# Patient Record
Sex: Female | Born: 1950 | Race: White | Hispanic: No | Smoking: Former smoker
Health system: Southern US, Community
[De-identification: ages and names within clinical notes are randomized; demographics above are authoritative.]

## PROBLEM LIST (undated history)

## (undated) DIAGNOSIS — E785 Hyperlipidemia, unspecified: Secondary | ICD-10-CM

## (undated) DIAGNOSIS — Z720 Tobacco use: Secondary | ICD-10-CM

## (undated) DIAGNOSIS — I10 Essential (primary) hypertension: Secondary | ICD-10-CM

## (undated) DIAGNOSIS — I2510 Atherosclerotic heart disease of native coronary artery without angina pectoris: Secondary | ICD-10-CM

## (undated) DIAGNOSIS — R413 Other amnesia: Secondary | ICD-10-CM

## (undated) DIAGNOSIS — F0390 Unspecified dementia without behavioral disturbance: Secondary | ICD-10-CM

## (undated) HISTORY — DX: Other amnesia: R41.3

## (undated) HISTORY — PX: CORONARY ANGIOPLASTY WITH STENT PLACEMENT: SHX49

## (undated) HISTORY — PX: OTHER SURGICAL HISTORY: SHX169

## (undated) HISTORY — DX: Hyperlipidemia, unspecified: E78.5

---

## 2002-08-15 ENCOUNTER — Emergency Department (HOSPITAL_COMMUNITY): Admission: EM | Admit: 2002-08-15 | Discharge: 2002-08-15 | Payer: Self-pay | Admitting: Emergency Medicine

## 2002-08-15 ENCOUNTER — Encounter: Payer: Self-pay | Admitting: Emergency Medicine

## 2006-06-30 ENCOUNTER — Emergency Department (HOSPITAL_COMMUNITY): Admission: EM | Admit: 2006-06-30 | Discharge: 2006-06-30 | Payer: Self-pay | Admitting: Emergency Medicine

## 2006-09-08 ENCOUNTER — Emergency Department (HOSPITAL_COMMUNITY): Admission: EM | Admit: 2006-09-08 | Discharge: 2006-09-08 | Payer: Self-pay | Admitting: Emergency Medicine

## 2011-10-08 ENCOUNTER — Emergency Department (HOSPITAL_COMMUNITY): Payer: No Typology Code available for payment source

## 2011-10-08 ENCOUNTER — Inpatient Hospital Stay (HOSPITAL_COMMUNITY)
Admission: EM | Admit: 2011-10-08 | Discharge: 2011-10-11 | DRG: 247 | Disposition: A | Discharge status: Home / Self Care | Payer: No Typology Code available for payment source | Attending: Cardiovascular Disease | Admitting: Cardiovascular Disease

## 2011-10-08 ENCOUNTER — Encounter (HOSPITAL_COMMUNITY): Payer: Self-pay

## 2011-10-08 ENCOUNTER — Encounter (HOSPITAL_COMMUNITY)
Admission: EM | Disposition: A | Discharge status: Home / Self Care | Payer: Self-pay | Source: Home / Self Care | Attending: Cardiovascular Disease

## 2011-10-08 DIAGNOSIS — I2510 Atherosclerotic heart disease of native coronary artery without angina pectoris: Secondary | ICD-10-CM

## 2011-10-08 DIAGNOSIS — F17200 Nicotine dependence, unspecified, uncomplicated: Secondary | ICD-10-CM | POA: Diagnosis present

## 2011-10-08 DIAGNOSIS — Z720 Tobacco use: Secondary | ICD-10-CM

## 2011-10-08 DIAGNOSIS — I2119 ST elevation (STEMI) myocardial infarction involving other coronary artery of inferior wall: Principal | ICD-10-CM | POA: Diagnosis present

## 2011-10-08 DIAGNOSIS — R079 Chest pain, unspecified: Secondary | ICD-10-CM

## 2011-10-08 DIAGNOSIS — E119 Type 2 diabetes mellitus without complications: Secondary | ICD-10-CM | POA: Diagnosis present

## 2011-10-08 DIAGNOSIS — I10 Essential (primary) hypertension: Secondary | ICD-10-CM | POA: Diagnosis present

## 2011-10-08 DIAGNOSIS — I498 Other specified cardiac arrhythmias: Secondary | ICD-10-CM | POA: Diagnosis present

## 2011-10-08 DIAGNOSIS — I213 ST elevation (STEMI) myocardial infarction of unspecified site: Secondary | ICD-10-CM

## 2011-10-08 DIAGNOSIS — E876 Hypokalemia: Secondary | ICD-10-CM | POA: Diagnosis present

## 2011-10-08 DIAGNOSIS — Z955 Presence of coronary angioplasty implant and graft: Secondary | ICD-10-CM

## 2011-10-08 DIAGNOSIS — I219 Acute myocardial infarction, unspecified: Secondary | ICD-10-CM

## 2011-10-08 HISTORY — DX: Tobacco use: Z72.0

## 2011-10-08 HISTORY — PX: LEFT HEART CATHETERIZATION WITH CORONARY ANGIOGRAM: SHX5451

## 2011-10-08 HISTORY — DX: Atherosclerotic heart disease of native coronary artery without angina pectoris: I25.10

## 2011-10-08 HISTORY — DX: Essential (primary) hypertension: I10

## 2011-10-08 LAB — CBC WITH DIFFERENTIAL/PLATELET
Basophils Absolute: 0 10*3/uL (ref 0.0–0.1)
Eosinophils Relative: 0 % (ref 0–5)
Lymphocytes Relative: 14 % (ref 12–46)
Lymphs Abs: 1.7 10*3/uL (ref 0.7–4.0)
MCV: 97.5 fL (ref 78.0–100.0)
Neutrophils Relative %: 79 % — ABNORMAL HIGH (ref 43–77)
Platelets: 194 10*3/uL (ref 150–400)
RBC: 3.99 MIL/uL (ref 3.87–5.11)
RDW: 13.2 % (ref 11.5–15.5)
WBC: 12.3 10*3/uL — ABNORMAL HIGH (ref 4.0–10.5)

## 2011-10-08 LAB — COMPREHENSIVE METABOLIC PANEL
ALT: 68 U/L — ABNORMAL HIGH (ref 0–35)
AST: 190 U/L — ABNORMAL HIGH (ref 0–37)
Alkaline Phosphatase: 60 U/L (ref 39–117)
CO2: 30 mEq/L (ref 19–32)
Calcium: 9.9 mg/dL (ref 8.4–10.5)
Glucose, Bld: 216 mg/dL — ABNORMAL HIGH (ref 70–99)
Potassium: 3.5 mEq/L (ref 3.5–5.1)
Sodium: 138 mEq/L (ref 135–145)
Total Protein: 7 g/dL (ref 6.0–8.3)

## 2011-10-08 LAB — CARDIAC PANEL(CRET KIN+CKTOT+MB+TROPI)
Relative Index: 4.5 — ABNORMAL HIGH (ref 0.0–2.5)
Relative Index: 3.2 — ABNORMAL HIGH (ref 0.0–2.5)
Total CK: 1985 U/L — ABNORMAL HIGH (ref 7–177)
Troponin I: >20 ng/mL (ref <0.30)

## 2011-10-08 LAB — MRSA PCR SCREENING: MRSA by PCR: NEGATIVE

## 2011-10-08 LAB — PROTIME-INR
INR: 1.02 (ref 0.00–1.49)
Prothrombin Time: 13.6 seconds (ref 11.6–15.2)

## 2011-10-08 LAB — GLUCOSE, CAPILLARY
Glucose-Capillary: 170 mg/dL — ABNORMAL HIGH (ref 70–99)
Glucose-Capillary: 181 mg/dL — ABNORMAL HIGH (ref 70–99)

## 2011-10-08 LAB — HEMOGLOBIN A1C: Mean Plasma Glucose: 137 mg/dL — ABNORMAL HIGH (ref <117)

## 2011-10-08 SURGERY — LEFT HEART CATHETERIZATION WITH CORONARY ANGIOGRAM
Anesthesia: LOCAL

## 2011-10-08 MED ORDER — BIVALIRUDIN 250 MG IV SOLR
INTRAVENOUS | Status: AC
  Filled 2011-10-08: qty 250

## 2011-10-08 MED ORDER — ASPIRIN 81 MG PO CHEW: 81.0000 mg | CHEWABLE_TABLET | Freq: Every day | ORAL | Status: DC

## 2011-10-08 MED ORDER — PRASUGREL HCL 10 MG PO TABS
ORAL_TABLET | ORAL | Status: AC
  Filled 2011-10-08: qty 6

## 2011-10-08 MED ORDER — ASPIRIN EC 81 MG PO TBEC
81.0000 mg | DELAYED_RELEASE_TABLET | Freq: Every day | ORAL | Status: DC
  Administered 2011-10-09 – 2011-10-11 (×3): 81 mg via ORAL
  Filled 2011-10-08 (×3): qty 1

## 2011-10-08 MED ORDER — LORAZEPAM 2 MG/ML IJ SOLN
0.5000 mg | Freq: Once | INTRAMUSCULAR | Status: AC
  Administered 2011-10-08: 0.5 mg via INTRAVENOUS
  Filled 2011-10-08: qty 1

## 2011-10-08 MED ORDER — ZOLPIDEM TARTRATE 5 MG PO TABS: 5.0000 mg | ORAL_TABLET | Freq: Every evening | ORAL | Status: DC | PRN

## 2011-10-08 MED ORDER — PNEUMOCOCCAL VAC POLYVALENT 25 MCG/0.5ML IJ INJ
0.5000 mL | INJECTION | INTRAMUSCULAR | Status: AC
  Filled 2011-10-08: qty 0.5

## 2011-10-08 MED ORDER — NICOTINE 21 MG/24HR TD PT24
21.0000 mg | MEDICATED_PATCH | Freq: Every day | TRANSDERMAL | Status: DC
  Administered 2011-10-08 – 2011-10-11 (×4): 21 mg via TRANSDERMAL
  Filled 2011-10-08 (×4): qty 1

## 2011-10-08 MED ORDER — ONDANSETRON HCL 4 MG/2ML IJ SOLN: 4.0000 mg | Freq: Four times a day (QID) | INTRAMUSCULAR | Status: DC | PRN

## 2011-10-08 MED ORDER — MIDAZOLAM HCL 2 MG/2ML IJ SOLN
INTRAMUSCULAR | Status: AC
  Filled 2011-10-08: qty 2

## 2011-10-08 MED ORDER — FENTANYL CITRATE 0.05 MG/ML IJ SOLN
INTRAMUSCULAR | Status: AC
  Filled 2011-10-08: qty 2

## 2011-10-08 MED ORDER — ACETAMINOPHEN 325 MG PO TABS: 650.0000 mg | ORAL_TABLET | ORAL | Status: DC | PRN

## 2011-10-08 MED ORDER — NITROGLYCERIN 0.4 MG SL SUBL: 0.4000 mg | SUBLINGUAL_TABLET | SUBLINGUAL | Status: DC | PRN

## 2011-10-08 MED ORDER — ACETAMINOPHEN 325 MG PO TABS
650.0000 mg | ORAL_TABLET | ORAL | Status: DC | PRN
  Administered 2011-10-08 – 2011-10-11 (×4): 650 mg via ORAL
  Filled 2011-10-08 (×4): qty 2

## 2011-10-08 MED ORDER — HEPARIN (PORCINE) IN NACL 100-0.45 UNIT/ML-% IJ SOLN
INTRAMUSCULAR | Status: AC
  Filled 2011-10-08: qty 250

## 2011-10-08 MED ORDER — HEPARIN BOLUS VIA INFUSION
4000.0000 [IU] | Freq: Once | INTRAVENOUS | Status: AC
  Administered 2011-10-08: 4000 [IU] via INTRAVENOUS

## 2011-10-08 MED ORDER — LISINOPRIL 20 MG PO TABS
20.0000 mg | ORAL_TABLET | Freq: Every day | ORAL | Status: DC
  Administered 2011-10-08 – 2011-10-11 (×4): 20 mg via ORAL
  Filled 2011-10-08 (×4): qty 1

## 2011-10-08 MED ORDER — SODIUM CHLORIDE 0.9 % IJ SOLN: 3.0000 mL | INTRAMUSCULAR | Status: DC | PRN

## 2011-10-08 MED ORDER — HEPARIN SODIUM (PORCINE) 5000 UNIT/ML IJ SOLN: 4000.0000 [IU] | INTRAMUSCULAR | Status: DC

## 2011-10-08 MED ORDER — PRASUGREL HCL 10 MG PO TABS
10.0000 mg | ORAL_TABLET | Freq: Every day | ORAL | Status: DC
  Administered 2011-10-09 – 2011-10-11 (×3): 10 mg via ORAL
  Filled 2011-10-08 (×3): qty 1

## 2011-10-08 MED ORDER — SODIUM CHLORIDE 0.9 % IV SOLN
0.2500 mg/kg/h | INTRAVENOUS | Status: AC
  Filled 2011-10-08: qty 250

## 2011-10-08 MED ORDER — FENTANYL CITRATE 0.05 MG/ML IJ SOLN
50.0000 ug | Freq: Once | INTRAMUSCULAR | Status: AC
  Administered 2011-10-08: 50 ug via INTRAVENOUS

## 2011-10-08 MED ORDER — HEPARIN (PORCINE) IN NACL 2-0.9 UNIT/ML-% IJ SOLN
INTRAMUSCULAR | Status: AC
  Filled 2011-10-08: qty 2000

## 2011-10-08 MED ORDER — ATROPINE SULFATE 1 MG/ML IJ SOLN
INTRAMUSCULAR | Status: AC
  Filled 2011-10-08: qty 1

## 2011-10-08 MED ORDER — SODIUM CHLORIDE 0.9 % IV SOLN
INTRAVENOUS | Status: DC
  Administered 2011-10-08: 12:00:00 via INTRAVENOUS

## 2011-10-08 MED ORDER — SODIUM CHLORIDE 0.9 % IV SOLN: INTRAVENOUS | Status: AC

## 2011-10-08 MED ORDER — NITROGLYCERIN 0.2 MG/ML ON CALL CATH LAB
INTRAVENOUS | Status: AC
  Filled 2011-10-08: qty 1

## 2011-10-08 MED ORDER — INSULIN ASPART 100 UNIT/ML ~~LOC~~ SOLN
0.0000 [IU] | Freq: Three times a day (TID) | SUBCUTANEOUS | Status: DC
  Administered 2011-10-08 – 2011-10-09 (×2): 3 [IU] via SUBCUTANEOUS
  Administered 2011-10-09 (×2): 2 [IU] via SUBCUTANEOUS
  Administered 2011-10-10 (×2): 3 [IU] via SUBCUTANEOUS

## 2011-10-08 MED ORDER — SODIUM CHLORIDE 0.9 % IV SOLN: 250.0000 mL | INTRAVENOUS | Status: DC | PRN

## 2011-10-08 MED ORDER — SODIUM CHLORIDE 0.9 % IJ SOLN
3.0000 mL | Freq: Two times a day (BID) | INTRAMUSCULAR | Status: DC
  Administered 2011-10-09 – 2011-10-10 (×3): 3 mL via INTRAVENOUS

## 2011-10-08 MED ORDER — LIDOCAINE HCL (PF) 1 % IJ SOLN
INTRAMUSCULAR | Status: AC
  Filled 2011-10-08: qty 30

## 2011-10-08 NOTE — CV Procedure (Signed)
   Cardiac Catheterization Operative Report  Nichole May [id number] 8/26/20131:50 PM Harlow Asa, MD  Procedure Performed:  1. Left Heart Catheterization 2. Selective Coronary Angiography 3. Left ventricular angiogram 4. PTCA/DES RCA   Operator: Verne Carrow, MD  Indication:    Acute inferior STEMI                         Procedure Details: The risks, benefits, complications, treatment options, and expected outcomes were discussed with the patient. The patient and/or family concurred with the proposed plan, giving verbal informed consent. This was placed in the chart as an emergency consent. The patient was brought to the cath lab via EMS. The patient was sedated with Versed and Fentanyl. The right groin was prepped and draped in the usual manner. Using the modified Seldinger access technique, a 6 French sheath was placed in the right femoral artery. Standard diagnostic catheters were used to perform selective coronary angiography. A JR4 guide was used to engage the RCA. She was found to have a totally occluded proximal RCA. I engaged the RCA with a JR4 guiding catheter. She was given a bolus of Angiomax and a drip was started. She was given Effient 60 mg po x 1. I then advanced a Cougar IC wire down the RCA. A 2.5 x 15 mm balloon was inflated twice in the total occlusion proximally. Flow was restored with the balloon inflations. I then deployed a 2.5 x 28 mm Promus Element DES in the proximal to mid RCA. This was post-dilated with a 2.5 x 20 mm [place] balloon x 2. There was an excellent angiographic result. The PL branch had good flow. The PDA had excellent flow but was totally occluded in the very distal segment, likely from downstream thrombus. The guiding catheter and wire were removed. A pigtail catheter was used to perform a left ventricular angiogram.  There were no immediate complications. The patient was taken to the recovery area in stable condition.   Hemodynamic  Findings: Central aortic pressure: 113/59 Left ventricular pressure: 107/13/18  Angiographic Findings:  Left main: No obstructive disease noted.   Left Anterior Descending Artery: Large caliber vessel that courses to the apex. The mid LAD has a 70% stenosis at the takeoff of the small diagonal branch. The distal LAD has mild plaque disease.   Circumflex Artery: Moderate sized vessel with 100% occlusion in the mid segment just after the small first obtuse marginal branch. The distal Circumflex and second marginal branch fills from left to left collaterals.   Right Coronary Artery: 100% proximal occlusion.   Left Ventricular Angiogram: LVEF =40% with inferior wall hypokinesis.   Impression: 1. Acute inferior STEMI secondary to occluded proximal RCA, now s/p DES x 1 proximal RCA 2. Moderate disease in mid LAD 3. 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 Segmental LV systolic dysfunction.   Recommendations: Will admit to CCU. Will run Angiomax for 2 hours at reduced rate. ASA/Effient/Ace-inh. No statin with elevated LFTs. No beta blocker with bradycardia. Will get echo in am. Will medically manage LAD disease for now. If she has recurrent angina, will need functional test on LAD (FFR).        Complications:  None. The patient tolerated the procedure well.

## 2011-10-08 NOTE — ED Notes (Signed)
Pt c/o burning in chest and epigastric area x 3 days.  Says pain radiates into both shoulders.

## 2011-10-08 NOTE — ED Provider Notes (Addendum)
History  This chart was scribed for No att. providers found by Shari Heritage. The patient was seen in room MCCL/NONE. Patient's care was started at 1140.     CSN: [id number]  Arrival date & time 10/08/11  1140   First MD Initiated Contact with Patient 10/08/11 1154      Chief Complaint  Patient presents with  . Chest Pain    Patient is a 61 y.o. female presenting with chest pain. The history is provided by the patient. No language interpreter was used.  Chest Pain The chest pain began 3 - 5 days ago (Episode today started at 7:30am). Chest pain occurs intermittently. The chest pain is resolved. The pain is associated with exertion. The severity of the pain is severe. The quality of the pain is described as aching. The pain does not radiate. Chest pain is worsened by exertion. Pertinent negatives for primary symptoms include no shortness of breath, no nausea and no vomiting.  Pertinent negatives for associated symptoms include no diaphoresis. Risk factors include smoking/tobacco exposure.  Her past medical history is significant for diabetes and hypertension.  Her family medical history is significant for early MI in family.    ORETHA WEISMANN is a 61 y.o. female with a 3-day history of chest and epigastric pain who presents to the Emergency Department complaining of aching, non-radiating, intermittent, severe chest pain onset 3 days ago. She states that her pain is completely gone now. Patient says that exertional chest pain began on Friday 3 days ago at work which she noticed when walking to the breakroom. She denies having had pain all day yesterday or the day before that. Pt awakened with pain this morning at 7:30 am and it was constant today.  Patient was sent to the ED by her PCP [name] after she saw him this morning for the same complaint. [name] gave her aspirin 324 mg to chew at 11:30am today. Patient denies nausea or vomiting. No diaphoresis or SOB. Pt has never had this pain  before.   Patient smokes 0.5 packs/day. She does not drink alcohol. Patient has a medical history of HTN and diabetes.  PCP Dr [name]  Past Medical History  Diagnosis Date  . Diabetes mellitus   . Hypertension   . Tobacco abuse   . CAD (coronary artery disease)     10/08/11 Inf STEMI    Past Surgical History  Procedure Date  . Denies     Family History  Problem Relation Age of Onset  . Heart attack Father    FOP MI  History  Substance Use Topics  . Smoking status: Current Everyday Smoker -- 0.5 packs/day    Types: Cigarettes  . Smokeless tobacco: Not on file  . Alcohol Use: No  employed  OB History    Grav Para Term Preterm Abortions TAB SAB Ect Mult Living                  Review of Systems  Constitutional: Negative for diaphoresis.  Respiratory: Negative for shortness of breath.   Cardiovascular: Positive for chest pain.  Gastrointestinal: Negative for nausea and vomiting.  All other systems reviewed and are negative.    Allergies  Review of patient's allergies indicates no known allergies.  Home Medications  No current outpatient prescriptions on file. Lisinopril Metformin  BP 153/71  Temp 98.1 F (36.7 C) (Oral)  Resp 20  Ht 5\' 6"  (1.676 m)  Wt 167 lb (75.751 kg)  BMI 26.95  kg/m2  SpO2 97%  Vital signs normal    Physical Exam  Nursing note and vitals reviewed. Constitutional: She is oriented to person, place, and time. She appears well-developed and well-nourished.  Non-toxic appearance. She does not appear ill. No distress.  HENT:  Head: Normocephalic and atraumatic.  Right Ear: External ear normal.  Left Ear: External ear normal.  Nose: Nose normal. No mucosal edema or rhinorrhea.  Mouth/Throat: Oropharynx is clear and moist and mucous membranes are normal. No dental abscesses or uvula swelling.  Eyes: Conjunctivae normal and EOM are normal. Pupils are equal, round, and reactive to light.  Neck: Normal range of motion and full  passive range of motion without pain. Neck supple.  Cardiovascular: Normal rate, regular rhythm and normal heart sounds.  Exam reveals no gallop and no friction rub.   No murmur heard. Pulmonary/Chest: Effort normal and breath sounds normal. No respiratory distress. She has no wheezes. She has no rhonchi. She has no rales. She exhibits no tenderness and no crepitus.  Abdominal: Soft. Normal appearance and bowel sounds are normal. She exhibits no distension. There is no tenderness. There is no rebound and no guarding.  Musculoskeletal: Normal range of motion. She exhibits no edema and no tenderness.       Moves all extremities well.   Neurological: She is alert and oriented to person, place, and time. She has normal strength. No cranial nerve deficit.  Skin: Skin is warm, dry and intact. No rash noted. No erythema. No pallor.  Psychiatric: She has a normal mood and affect. Her speech is normal and behavior is normal. Her mood appears not anxious.    ED Course  Procedures (including critical care time)   Medications  0.9 %  sodium chloride infusion (  Intravenous New Bag/Given 10/08/11 1224)  heparin 100-0.45 UNIT/ML-% infusion (not administered)  heparin bolus via infusion 4,000 Units (  Intravenous MAR Hold 10/08/11 1243)   NTG not started, pt is painfree at this point and has inferior MI with brief episode of bradycardia to 38 in the ED.   DIAGNOSTIC STUDIES: Oxygen Saturation is 97% on room air, adequate by my interpretation.    COORDINATION OF CARE: 11:56am- Patient informed of current plan for treatment and evaluation and agrees with plan at this time.   12:05 Dr Sanjuana Kava accepts in transfer to [facility] cath lab   Results for orders placed during the hospital encounter of 10/08/11  CBC WITH DIFFERENTIAL      Component Value Range   WBC 12.3 (*) 4.0 - 10.5 K/uL   RBC 3.99  3.87 - 5.11 MIL/uL   Hemoglobin 13.3  12.0 - 15.0 g/dL   HCT 16.1  09.6 - 04.5 %   MCV 97.5  78.0 - 100.0 fL     MCH 33.3  26.0 - 34.0 pg   MCHC 34.2  30.0 - 36.0 g/dL   RDW 40.9  81.1 - 91.4 %   Platelets 194  150 - 400 K/uL   Neutrophils Relative 79 (*) 43 - 77 %   Neutro Abs 9.7 (*) 1.7 - 7.7 K/uL   Lymphocytes Relative 14  12 - 46 %   Lymphs Abs 1.7  0.7 - 4.0 K/uL   Monocytes Relative 7  3 - 12 %   Monocytes Absolute 0.9  0.1 - 1.0 K/uL   Eosinophils Relative 0  0 - 5 %   Eosinophils Absolute 0.0  0.0 - 0.7 K/uL   Basophils Relative 0  0 -  1 %   Basophils Absolute 0.0  0.0 - 0.1 K/uL  COMPREHENSIVE METABOLIC PANEL      Component Value Range   Sodium 138  135 - 145 mEq/L   Potassium 3.5  3.5 - 5.1 mEq/L   Chloride 98  96 - 112 mEq/L   CO2 30  19 - 32 mEq/L   Glucose, Bld 216 (*) 70 - 99 mg/dL   BUN 18  6 - 23 mg/dL   Creatinine, Ser 4.09  0.50 - 1.10 mg/dL   Calcium 9.9  8.4 - 81.1 mg/dL   Total Protein 7.0  6.0 - 8.3 g/dL   Albumin 4.2  3.5 - 5.2 g/dL   AST 914 (*) 0 - 37 U/L   ALT 68 (*) 0 - 35 U/L   Alkaline Phosphatase 60  39 - 117 U/L   Total Bilirubin 0.9  0.3 - 1.2 mg/dL   GFR calc non Af Amer >90  >90 mL/min   GFR calc Af Amer >90  >90 mL/min  TROPONIN I      Component Value Range   Troponin I >20.00 (*) <0.30 ng/mL  APTT      Component Value Range   aPTT 27  24 - 37 seconds  PROTIME-INR      Component Value Range   Prothrombin Time 13.6  11.6 - 15.2 seconds   INR 1.02  0.00 - 1.49  GLUCOSE, CAPILLARY      Component Value Range   Glucose-Capillary 170 (*) 70 - 99 mg/dL   . Laboratory interpretation all normal except hyperglycemia, + troponin    Dg Chest Portable 1 View  10/08/2011  *RADIOLOGY REPORT*  Clinical Data: 61 year old female with chest pain.  Hypertension diabetes.  ST elevation myocardial infarction.  PORTABLE CHEST - 1 VIEW  Comparison: None.  Findings: Portable upright AP view 1210 hours.  Cardiac size at the upper limits of normal or mildly enlarged. Other mediastinal contours are within normal limits.  Visualized tracheal air column is within  normal limits.  No pneumothorax, pulmonary edema, pleural effusion or airspace disease.  IMPRESSION: Mild cardiomegaly. No acute cardiopulmonary abnormality.   Original Report Authenticated By: Harley Hallmark, M.D.      #1  Date: 10/08/2011  Rate: 83  Rhythm: normal sinus rhythm  QRS Axis: normal  Intervals: normal  ST/T Wave abnormalities: ST elevations inferiorly and ST depressions laterally  Conduction Disutrbances:none  Narrative Interpretation:   Old EKG Reviewed: none available  #2  Date: 10/08/2011  Rate: 48  Rhythm: sinus bradycardia  QRS Axis: normal  Intervals: normal  ST/T Wave abnormalities: More pronounced ST elevation inferiorly and ST depression laterally with flipped TW in lateral chest leads  Conduction Disutrbances:none  Narrative Interpretation:   Old EKG Reviewed: changes noted EKG done slightly earlier      1. Chest pain   2. Acute myocardial infarction   3. STEMI (ST elevation myocardial infarction)     Plan transfer to [facility] Cath Lab  MDM   I personally performed the services described in this documentation, which was scribed in my presence. The recorded information has been reviewed and considered.  Devoria Albe, MD, FACEP    Ward Givens, MD 10/08/11 1421  Ward Givens, MD 11/07/11 1505

## 2011-10-08 NOTE — ED Notes (Signed)
Called [facility] CODE STEMI 

## 2011-10-08 NOTE — H&P (Signed)
I have personally seen and examined this patient with [facility], PA-C. I agree with the assessment and plan as outlined above. Emergent cath. cdm

## 2011-10-08 NOTE — Progress Notes (Signed)
CTSP for agitation and confusion; patient is s/p PCI following inferior MI; patient given ativan 1 hour prior to sheath pull. She then became confused and combative. She moves all extremities. BP 119/70 and HR 70. No focal neurological findings; patient given romazicon with some improvement; follow closely. Doubt CVA but if she doesn't improve, may need head CT. Olga Millers 6:47 PM

## 2011-10-08 NOTE — Care Management Note (Addendum)
    Page 1 of 1   10/09/2011     9:51:15 AM   CARE MANAGEMENT NOTE 10/09/2011  Patient:  JANCY, SPRANKLE   Account Number:  [id number]  Date Initiated:  10/08/2011  Documentation initiated by:  Junius Creamer  Subjective/Objective Assessment:   adm w mi     Action/Plan:   lives w husband, pcp dr Nelva Nay luking   Anticipated DC Date:     Anticipated DC Plan:        DC Planning Services  CM consult      Choice offered to / List presented to:             Status of service:   Medicare Important Message given?   (If response is "NO", the following Medicare IM given date fields will be blank) Date Medicare IM given:   Date Additional Medicare IM given:    Discharge Disposition:    Per UR Regulation:  Reviewed for med. necessity/level of care/duration of stay  If discussed at Long Length of Stay Meetings, dates discussed:    Comments:  8/27 9:49a debbie Liliauna Santoni rn,bsn spoke w pt who came back as [phone] copay. gave pt 30days free effient card and effient copay assist card.  8/26 14:40p debbie Jonanthan Bolender rn,bsn [phone] will get cm sec to ck on copay for effient.

## 2011-10-08 NOTE — Progress Notes (Signed)
Jessica PA notified of increasing agitation and refusal to keep right leg straight. Patient verbalizes understanding of risk of moving from side to side and sitting straight up in bed. Patient attempting OOB without calling nurse. Pt oriented and states "I understand what your saying mamma". Family at bedside. Daughter states she is used to a "cigarette hanging out of her mouth every 5 minutes".  Patient repeatedly instructed by nurse the risks of increased movement and need to lay still. New orders received. Will continue to follow closely.

## 2011-10-08 NOTE — Progress Notes (Signed)
Patient became very agitated and restless at beginning of sheath removal. Patient awakened upon entering room to pull sheath. Unable to follow commands or verbalize understanding of situation. Patient confused and anxious.Patient thrashing her fists at the nurses and kicking vigorously. Patient stating she is at home and other answers are slurred. No weakness to either side or facial drooping noted. MD called to bedside. Ramazicon given per verbal order. Patient placed in 4 point restraints, knee immobilizer, and posey belt. After ramazicon given patient did state she was at the hospital. Family updated and at bedside to help calm the patient. MD to follow closely tonight and nurse to monitor and continue close assessment.

## 2011-10-08 NOTE — Progress Notes (Signed)
Right femoral sheath removed using manual pressure. Held for 20 minutes. Hemostasis achieved at 1815. VSS.

## 2011-10-08 NOTE — H&P (Signed)
History and Physical  Patient ID: Nichole May MRN: [id number], SOB: [birth year] 61 y.o. Date of Encounter: 10/08/2011, 1:19 PM  Primary Physician: Harlow Asa, MD Primary Cardiologist: None  Chief Complaint: chest pain  HPI: 61 y.o. female w/ PMHx significant for DM, HTN, and tobacco abuse who presented to [facility] on 10/08/2011 with complaints of chest pain and was subsequently transferred to [facility] for Inferior STEMI.  No prior cardiac history. She reported onset of chest pain last Friday (10/05/11) that has been intermittent. She went to her PCP office this morning where she reportedly received 324mg  ASA and was sent to the [facility] ED. She was chest pain free in the ED. EKG showed ST elevation II, III, aVF, ST depression I, aVL, V4-6. She had a brief episode of bradycardia in the ED (38bpm). She was started on Heparin drip and transferred to [facility] cath lab.  Past Medical History  Diagnosis Date  . Diabetes mellitus   . Hypertension   . Tobacco abuse   . CAD (coronary artery disease)     10/08/11 Inf STEMI     Surgical History:  Past Surgical History  Procedure Date  . Denies      Home Meds:  Lisinopril Metformin  Allergies: No Known Allergies  History   Social History  . Marital Status: Married    Spouse Name: N/A    Number of Children: N/A  . Years of Education: N/A   Occupational History  . Not on file.   Social History Main Topics  . Smoking status: Current Everyday Smoker -- 0.5 packs/day    Types: Cigarettes  . Smokeless tobacco: Not on file  . Alcohol Use: No  . Drug Use: No  . Sexually Active: Not on file   Other Topics Concern  . Not on file   Social History Narrative  . No narrative on file     Family History  Problem Relation Age of Onset  . Heart attack Father     Review of Systems: General: negative for chills, fever, night sweats or weight changes.  Cardiovascular: (+) chest pain; negative for shortness  of breath, dyspnea on exertion, edema, orthopnea, palpitations, paroxysmal nocturnal dyspnea  Dermatological: negative for rash Respiratory: negative for cough or wheezing Urologic: negative for hematuria Abdominal: negative for nausea, vomiting, diarrhea, bright red blood per rectum, melena, or hematemesis Neurologic: negative for visual changes, syncope, or dizziness All other systems reviewed and are otherwise negative except as noted above.  Labs:   Lab 10/08/11 1153  NA 138  K 3.5  CL 98  CO2 30  BUN 18  CREATININE 0.59  CALCIUM 9.9  PROT 7.0  BILITOT 0.9  ALKPHOS 60  ALT 68*  AST 190*  GLUCOSE 216*     10/08/2011 12:04  Prothrombin Time 13.6  INR 1.02    10/08/2011 11:53  Troponin I >20.00 (HH)   Radiology/Studies:  No results found.   EKG: 10/08/11 @ 1143 - sinus rhythm with 1mm ST elevation II, III, aVF and ST depression I, aVL, V4-6  Physical Exam: Blood pressure 104/79, temperature 98.1 F (36.7 C), temperature source Oral, resp. rate 18, height 5\' 6"  (1.676 m), weight 160 lb (72.576 kg), SpO2 99.00%. General: Overweight elderly white female, in no acute distress. Head: Normocephalic, atraumatic, sclera non-icteric, nares are without discharge Neck: Supple. Negative for carotid bruits. JVD not elevated. Lungs: Clear anteriorly. Breathing is unlabored. Heart: RRR with S1 S2. No murmurs, rubs, or  gallops appreciated. Abdomen: Soft, non-tender, non-distended with normoactive bowel sounds. No rebound/guarding. No obvious abdominal masses. Msk:  Strength and tone appear normal for age. Extremities: No edema. No clubbing or cyanosis. Distal pedal pulses are intact and equal bilaterally. Neuro: Alert and oriented X 3. Moves all extremities spontaneously. Psych:  Responds to questions appropriately with a normal affect.    ASSESSMENT AND PLAN:  61 y.o. female w/ PMHx significant for DM, HTN, and tobacco abuse who presented to [facility] on 10/08/2011 with  complaints of chest pain and was subsequently transferred to [facility] for Inferior STEMI.  1. Acute Inferior STEMI: Patient has no prior cardiac history. Cardiac risk factors including HTN, DM, age, tobacco abuse. Presents with 3 days of intermittent chest pain and EKG showing inferior ST elevation. Currently undergoing cardiac cath. Further plans pending results. Place on ASA. No BB given bradycardia. No statin given elevated LFTs. Check Lipid panel and plan to start statin once LFTs improve.  2. Elevated LFTs: In the setting of #1. Continue to monitor.  3. Leukocytosis: In the setting of #1. Afebrile. CXR clear.  4. Hypertension: BP stable. Cont Lisinopril  5. Diabetes Mellitus, Type 2: Hold Metformin. Place on SSI. Check A1C  6. Tobacco Abuse: Encourage cessation.    Signed, Addasyn Mcbreen PA-C 10/08/2011, 1:19 PM

## 2011-10-09 DIAGNOSIS — F17200 Nicotine dependence, unspecified, uncomplicated: Secondary | ICD-10-CM

## 2011-10-09 DIAGNOSIS — E119 Type 2 diabetes mellitus without complications: Secondary | ICD-10-CM

## 2011-10-09 DIAGNOSIS — I2510 Atherosclerotic heart disease of native coronary artery without angina pectoris: Secondary | ICD-10-CM

## 2011-10-09 DIAGNOSIS — Z720 Tobacco use: Secondary | ICD-10-CM

## 2011-10-09 DIAGNOSIS — I517 Cardiomegaly: Secondary | ICD-10-CM

## 2011-10-09 DIAGNOSIS — I219 Acute myocardial infarction, unspecified: Secondary | ICD-10-CM

## 2011-10-09 LAB — BASIC METABOLIC PANEL
BUN: 17 mg/dL (ref 6–23)
CO2: 23 mEq/L (ref 19–32)
Calcium: 9.1 mg/dL (ref 8.4–10.5)
Chloride: 103 mEq/L (ref 96–112)
Creatinine, Ser: 0.43 mg/dL — ABNORMAL LOW (ref 0.50–1.10)
Glucose, Bld: 168 mg/dL — ABNORMAL HIGH (ref 70–99)
Potassium: 3.4 mEq/L — ABNORMAL LOW (ref 3.5–5.1)
Sodium: 140 mEq/L (ref 135–145)

## 2011-10-09 LAB — CBC
HCT: 35.8 % — ABNORMAL LOW (ref 36.0–46.0)
MCHC: 33.8 g/dL (ref 30.0–36.0)
Platelets: 145 10*3/uL — ABNORMAL LOW (ref 150–400)
RDW: 13.1 % (ref 11.5–15.5)
WBC: 12.3 10*3/uL — ABNORMAL HIGH (ref 4.0–10.5)

## 2011-10-09 LAB — LIPID PANEL
HDL: 33 mg/dL — ABNORMAL LOW (ref >39)
LDL Cholesterol: 62 mg/dL (ref 0–99)
Total CHOL/HDL Ratio: 4.1 RATIO
VLDL: 40 mg/dL (ref 0–40)

## 2011-10-09 LAB — CARDIAC PANEL(CRET KIN+CKTOT+MB+TROPI)
Relative Index: 2.3 (ref 0.0–2.5)
Total CK: 1412 U/L — ABNORMAL HIGH (ref 7–177)
Troponin I: >20 ng/mL (ref <0.30)

## 2011-10-09 MED ORDER — POTASSIUM CHLORIDE CRYS ER 20 MEQ PO TBCR
60.0000 meq | EXTENDED_RELEASE_TABLET | Freq: Once | ORAL | Status: AC
  Administered 2011-10-09: 60 meq via ORAL
  Filled 2011-10-09: qty 3

## 2011-10-09 MED FILL — Dextrose Inj 5%: INTRAVENOUS | Qty: 50 | Status: AC

## 2011-10-09 NOTE — Progress Notes (Signed)
SUBJECTIVE: No chest pain or SOB this am. Confusion mostly resolved.  BP 113/79  Pulse 66  Temp 98.3 F (36.8 C) (Oral)  Resp 34  Ht 5\' 5"  (1.651 m)  Wt 156 lb 8.4 oz (71 kg)  BMI 26.05 kg/m2  SpO2 96%  Intake/Output Summary (Last 24 hours) at 10/09/11 0710 Last data filed at 10/08/11 2100  Gross per 24 hour  Intake 669.08 ml  Output    650 ml  Net  19.08 ml    PHYSICAL EXAM General: Well developed, well nourished, in no acute distress. Alert and oriented x 2. Still confused about time of day. Psych:  Good affect, responds appropriately Neck: No JVD. No masses noted.  Lungs: Faint wheezes bilaterally. No crackles noted.  Heart: RRR with no murmurs noted. Abdomen: Bowel sounds are present. Soft, non-tender.  Extremities: No lower extremity edema. Right groin cath site ok.   LABS: Basic Metabolic Panel:  Basename 10/09/11 0255 10/08/11 1153  NA 140 138  K 3.4* 3.5  CL 103 98  CO2 23 30  GLUCOSE 168* 216*  BUN 17 18  CREATININE 0.43* 0.59  CALCIUM 9.1 9.9  MG -- --  PHOS -- --   CBC:  Basename 10/09/11 0255 10/08/11 1153  WBC 12.3* 12.3*  NEUTROABS -- 9.7*  HGB 12.1 13.3  HCT 35.8* 38.9  MCV 97.0 97.5  PLT 145* 194   Cardiac Enzymes:  Basename 10/09/11 0255 10/08/11 2021 10/08/11 1453  CKTOTAL 1412* 1924* 1985*  CKMB 32.7* 61.1* 90.0*  CKMBINDEX -- -- --  TROPONINI >20.00* >20.00* >20.00*   Fasting Lipid Panel:  Basename 10/09/11 0255  CHOL 135  HDL 33*  LDLCALC 62  TRIG 469*  CHOLHDL 4.1  LDLDIRECT --    Current Meds:    . aspirin EC  81 mg Oral Daily  . atropine      . bivalirudin      . fentaNYL      . fentaNYL  50 mcg Intravenous Once  . heparin      . heparin      . heparin  4,000 Units Intravenous Once  . insulin aspart  0-15 Units Subcutaneous TID WC  . lidocaine      . lisinopril  20 mg Oral Daily  . LORazepam  0.5 mg Intravenous Once  . midazolam      . nicotine  21 mg Transdermal Daily  . nitroGLYCERIN      .  pneumococcal 23 valent vaccine  0.5 mL Intramuscular Tomorrow-1000  . prasugrel      . prasugrel  10 mg Oral Daily  . sodium chloride  3 mL Intravenous Q12H  . DISCONTD: aspirin  81 mg Oral Daily  . DISCONTD: heparin  4,000 Units Intravenous STAT     ASSESSMENT AND PLAN:  1. Acute inferior STEMI: Emergent cath 10/08/11. Proximal RCA with 100% occlusion. Now s/p DES x 1 proximal RCA. She is on ASA and Effient. She will need dual anti-platelet therapy for at least one year. Beta blocker was not started secondary to bradycardia. HR in 50s overnight. Will not start beta blocker today secondary to bradycardia. Continue Ace-inhibitor. No statin secondary to abnormal LFTs. Will recheck LFTs in am.  Echo today.   2. CAD: As above. Admitted with STEMI.   3. DM: SSI for now. Restart metformin 48 hours post cath.   4. Hypokalemia: Replace potassium this am  5. Tobacco abuse: Smoking cessation has been advised. Nicotine patch in place.  6. Dispo: To stepdown today. Ambulate with cardiac rehab.   MCALHANY,CHRISTOPHER  8/27/20137:10 AM

## 2011-10-09 NOTE — Progress Notes (Signed)
CARDIAC REHAB PHASE I   PRE:  Rate/Rhythm: 56 SB    BP: sitting 137/70    SaO2:   MODE:  Ambulation: 170 ft   POST:  Rate/Rhythm: 75 SR    BP: sitting 137/76     SaO2: 98 RA after a few min rest  Pt with strange affect. Sts many times "I don't care about anything". Could not get out of pt if she has been feeling like this for long. Sts she doesn't feel well today. Very tired. Slightly unsteady walking. Rested at half way due to SOB and fatigue. Denied CP. Able to make it to new room but asked multiple times on the way if we were there yet. Collapsed into recliner. VSS seemingly stable although did not have pulse ox immediately available after walk. Left pt in recliner. Seems exhausted. Left MI book. Unable to hear ed right now. Will f/u. [phone]  Harriet Masson CES, ACSM

## 2011-10-09 NOTE — Progress Notes (Signed)
  Echocardiogram 2D Echocardiogram has been performed.  [place] 10/09/2011, 10:37 AM

## 2011-10-10 LAB — COMPREHENSIVE METABOLIC PANEL
Albumin: 3 g/dL — ABNORMAL LOW (ref 3.5–5.2)
Alkaline Phosphatase: 58 U/L (ref 39–117)
BUN: 17 mg/dL (ref 6–23)
CO2: 26 mEq/L (ref 19–32)
Chloride: 105 mEq/L (ref 96–112)
GFR calc non Af Amer: >90 mL/min (ref >90)
Potassium: 3.5 mEq/L (ref 3.5–5.1)
Total Bilirubin: 0.8 mg/dL (ref 0.3–1.2)

## 2011-10-10 LAB — CBC
MCH: 33.9 pg (ref 26.0–34.0)
MCV: 99.1 fL (ref 78.0–100.0)
Platelets: 154 10*3/uL (ref 150–400)
RDW: 13.3 % (ref 11.5–15.5)
WBC: 9.7 10*3/uL (ref 4.0–10.5)

## 2011-10-10 LAB — GLUCOSE, CAPILLARY

## 2011-10-10 MED ORDER — PNEUMOCOCCAL VAC POLYVALENT 25 MCG/0.5ML IJ INJ
0.5000 mL | INJECTION | Freq: Once | INTRAMUSCULAR | Status: DC
  Filled 2011-10-10: qty 0.5

## 2011-10-10 MED ORDER — POTASSIUM CHLORIDE CRYS ER 20 MEQ PO TBCR
40.0000 meq | EXTENDED_RELEASE_TABLET | Freq: Once | ORAL | Status: AC
  Administered 2011-10-10: 40 meq via ORAL
  Filled 2011-10-10: qty 2

## 2011-10-10 MED ORDER — ATORVASTATIN CALCIUM 40 MG PO TABS
40.0000 mg | ORAL_TABLET | Freq: Every day | ORAL | Status: DC
  Administered 2011-10-10: 40 mg via ORAL
  Filled 2011-10-10 (×4): qty 1

## 2011-10-10 NOTE — Progress Notes (Signed)
SUBJECTIVE: No chest pain or SOB.   BP 109/65  Pulse 59  Temp 99.5 F (37.5 C) (Oral)  Resp 16  Ht 5\' 5"  (1.651 m)  Wt 156 lb 8.4 oz (71 kg)  BMI 26.05 kg/m2  SpO2 90%  Intake/Output Summary (Last 24 hours) at 10/10/11 8119 Last data filed at 10/09/11 1800  Gross per 24 hour  Intake    720 ml  Output      0 ml  Net    720 ml    PHYSICAL EXAM General: Well developed, well nourished, in no acute distress. Alert and oriented x 3.  Psych:  Good affect, responds appropriately Neck: No JVD. No masses noted.  Lungs: Clear bilaterally with no wheezes or rhonci noted.  Heart: RRR with no murmurs noted. Abdomen: Bowel sounds are present. Soft, non-tender.  Extremities: No lower extremity edema. Right groin cath site stable.   LABS: Basic Metabolic Panel:  Basename 10/10/11 0600 10/09/11 0255  NA 141 140  K 3.5 3.4*  CL 105 103  CO2 26 23  GLUCOSE 157* 168*  BUN 17 17  CREATININE 0.42* 0.43*  CALCIUM 8.5 9.1  MG -- --  PHOS -- --   CBC:  Basename 10/10/11 0600 10/09/11 0255 10/08/11 1153  WBC 9.7 12.3* --  NEUTROABS -- -- 9.7*  HGB 11.1* 12.1 --  HCT 32.4* 35.8* --  MCV 99.1 97.0 --  PLT 154 145* --   Cardiac Enzymes:  Basename 10/09/11 0255 10/08/11 2021 10/08/11 1453  CKTOTAL 1412* 1924* 1985*  CKMB 32.7* 61.1* 90.0*  CKMBINDEX -- -- --  TROPONINI >20.00* >20.00* >20.00*   Fasting Lipid Panel:  Basename 10/09/11 0255  CHOL 135  HDL 33*  LDLCALC 62  TRIG 147*  CHOLHDL 4.1  LDLDIRECT --    Current Meds:    . aspirin EC  81 mg Oral Daily  . insulin aspart  0-15 Units Subcutaneous TID WC  . lisinopril  20 mg Oral Daily  . nicotine  21 mg Transdermal Daily  . pneumococcal 23 valent vaccine  0.5 mL Intramuscular Tomorrow-1000  . potassium chloride  60 mEq Oral Once  . prasugrel  10 mg Oral Daily  . sodium chloride  3 mL Intravenous Q12H   Echo 10/10/11: Left ventricle: The cavity size was normal. Wall thickness was increased in a pattern of  mild LVH. Systolic function was mildly to moderately reduced. The estimated ejection fraction was in the range of 40% to 45%. Basal to mid inferior and inferoseptal severe hypokinesis to akinesis. Basal to mid posterior severe hypokinesis. Doppler parameters are consistent with abnormal left ventricular relaxation (grade 1 diastolic dysfunction). - Aortic valve: Poorly visualized. Probably trileaflet. There was no stenosis. - Mitral valve: No significant regurgitation. - Right ventricle: The cavity size was mildly dilated. Systolic function was mildly reduced. - Right atrium: The atrium was mildly dilated. - Pulmonary arteries: No complete TR doppler jet so unable to estimate PA systolic pressure. - Systemic veins: IVC measured 2.2 cm with some respirophasic variation, suggesting RA pressure 10 mmHg. Impressions:  - Normal LV size with midl LV hypertrophy. EF 40-45% with basal to mid inferior, inferoseptal, and posterior severe hypokinesis to akinesis. Mildly dilated RV with mildly decreased RV systolic function. No significant valvular abnormalities.    ASSESSMENT AND PLAN:  1. Acute inferior STEMI: Emergent cath 10/08/11. Proximal RCA with 100% occlusion. Now s/p DES x 1 proximal RCA. She is on ASA and Effient. She will need dual  anti-platelet therapy for at least one year. Beta blocker was not started secondary to bradycardia. HR is 59 this am.  Will not start beta blocker today secondary to bradycardia. Continue Ace-inhibitor. No statin secondary to abnormal LFTs on admission but now near normal. Will add statin today. Medical management of moderate disease in LAD and totally occluded Circumflex (good collaterals).    2. CAD: As above. Admitted with STEMI. CK trending down.   3. DM: SSI for now. Restart metformin tomorrow. (holding 48 hours post cath).  4. Hypokalemia: Potassium replaced yesterday. BMET pending this am.   5. Tobacco abuse: Smoking cessation has been advised.  Nicotine patch in place.   6. Dispo: To telemetry today. Ambulate with cardiac rehab.     Nichole May  8/28/20137:07 AM

## 2011-10-10 NOTE — Progress Notes (Signed)
CARDIAC REHAB PHASE I   PRE:  Rate/Rhythm: 84 SR    BP: sitting 102/66    SaO2:   MODE:  Ambulation: 380 ft   POST:  Rate/Rhythm: 84 SR    BP: sitting 130/74     SaO2:   Pt steady but tires very easily. Sts she has been like this for 1 yr. Feels exhausted back in room after walk. VSS. Pt slightly anxious/paranoid. Began explaining MI then family came in, which increased reception of education. Pt motivated to quit smoking. Sts she is not craving cigarette now. Encouraged patches. Gave resources. Requests her name be sent to [facility]. Needs to walk more.  [phone]   Harriet Masson CES, ACSM

## 2011-10-11 LAB — CBC
HCT: 31.1 % — ABNORMAL LOW (ref 36.0–46.0)
Hemoglobin: 10.6 g/dL — ABNORMAL LOW (ref 12.0–15.0)
MCV: 98.1 fL (ref 78.0–100.0)
RBC: 3.17 MIL/uL — ABNORMAL LOW (ref 3.87–5.11)
WBC: 7.6 10*3/uL (ref 4.0–10.5)

## 2011-10-11 LAB — BASIC METABOLIC PANEL
CO2: 25 mEq/L (ref 19–32)
Chloride: 104 mEq/L (ref 96–112)
Sodium: 140 mEq/L (ref 135–145)

## 2011-10-11 LAB — GLUCOSE, CAPILLARY: Glucose-Capillary: 130 mg/dL — ABNORMAL HIGH (ref 70–99)

## 2011-10-11 MED ORDER — PRASUGREL HCL 10 MG PO TABS: 10.0000 mg | ORAL_TABLET | Freq: Every day | ORAL | Status: DC

## 2011-10-11 MED ORDER — POTASSIUM CHLORIDE CRYS ER 20 MEQ PO TBCR: 20.0000 meq | EXTENDED_RELEASE_TABLET | Freq: Once | ORAL | Status: DC

## 2011-10-11 MED ORDER — NICOTINE 21 MG/24HR TD PT24: 1.0000 | MEDICATED_PATCH | Freq: Every day | TRANSDERMAL | Status: AC

## 2011-10-11 MED ORDER — POTASSIUM CHLORIDE CRYS ER 20 MEQ PO TBCR
20.0000 meq | EXTENDED_RELEASE_TABLET | Freq: Once | ORAL | Status: AC
  Administered 2011-10-11: 40 meq via ORAL
  Filled 2011-10-11: qty 2

## 2011-10-11 MED ORDER — POTASSIUM CHLORIDE CRYS ER 20 MEQ PO TBCR
40.0000 meq | EXTENDED_RELEASE_TABLET | Freq: Once | ORAL | Status: AC
  Administered 2011-10-11: 40 meq via ORAL

## 2011-10-11 MED ORDER — METFORMIN HCL 500 MG PO TABS
500.0000 mg | ORAL_TABLET | Freq: Two times a day (BID) | ORAL | Status: DC
  Administered 2011-10-11: 500 mg via ORAL
  Filled 2011-10-11 (×3): qty 1

## 2011-10-11 MED ORDER — NITROGLYCERIN 0.4 MG SL SUBL: 0.4000 mg | SUBLINGUAL_TABLET | SUBLINGUAL | Status: DC | PRN

## 2011-10-11 NOTE — Progress Notes (Signed)
SUBJECTIVE: No events. No chest pain or SOB.   BP 128/84  Pulse 59  Temp 98.7 F (37.1 C) (Oral)  Resp 16  Ht 5\' 5"  (1.651 m)  Wt 165 lb 11.2 oz (75.161 kg)  BMI 27.57 kg/m2  SpO2 91%  Intake/Output Summary (Last 24 hours) at 10/11/11 1610 Last data filed at 10/10/11 1730  Gross per 24 hour  Intake    720 ml  Output      0 ml  Net    720 ml    PHYSICAL EXAM General: Well developed, well nourished, in no acute distress. Alert and oriented x 3.  Psych:  Good affect, responds appropriately Neck: No JVD. No masses noted.  Lungs: Clear bilaterally with no wheezes or rhonci noted.  Heart: RRR with no murmurs noted. Abdomen: Bowel sounds are present. Soft, non-tender.  Extremities: No lower extremity edema.   LABS: Basic Metabolic Panel:  Basename 10/10/11 0600 10/09/11 0255  NA 141 140  K 3.5 3.4*  CL 105 103  CO2 26 23  GLUCOSE 157* 168*  BUN 17 17  CREATININE 0.42* 0.43*  CALCIUM 8.5 9.1  MG -- --  PHOS -- --   CBC:  Basename 10/11/11 0503 10/10/11 0600 10/08/11 1153  WBC 7.6 9.7 --  NEUTROABS -- -- 9.7*  HGB 10.6* 11.1* --  HCT 31.1* 32.4* --  MCV 98.1 99.1 --  PLT 171 154 --   Cardiac Enzymes:  Basename 10/09/11 0255 10/08/11 2021 10/08/11 1453  CKTOTAL 1412* 1924* 1985*  CKMB 32.7* 61.1* 90.0*  CKMBINDEX -- -- --  TROPONINI >20.00* >20.00* >20.00*   Fasting Lipid Panel:  Basename 10/09/11 0255  CHOL 135  HDL 33*  LDLCALC 62  TRIG 960*  CHOLHDL 4.1  LDLDIRECT --    Current Meds:    . aspirin EC  81 mg Oral Daily  . atorvastatin  40 mg Oral q1800  . insulin aspart  0-15 Units Subcutaneous TID WC  . lisinopril  20 mg Oral Daily  . nicotine  21 mg Transdermal Daily  . pneumococcal 23 valent vaccine  0.5 mL Intramuscular Tomorrow-1000  . pneumococcal 23 valent vaccine  0.5 mL Intramuscular Once  . potassium chloride  40 mEq Oral Once  . prasugrel  10 mg Oral Daily  . sodium chloride  3 mL Intravenous Q12H     ASSESSMENT AND  PLAN:  1. Acute inferior STEMI: Emergent cath 10/08/11. Proximal RCA with 100% occlusion. Now s/p DES x 1 proximal RCA. She is on ASA and Effient. She will need dual anti-platelet therapy for at least one year. Beta blocker was not started secondary to bradycardia. HR has been in the 50s and low 60s. Will not start beta blocker today secondary to bradycardia. Continue Ace-inhibitor. LFTs were elevated on admission but near normal yesterday. Statin started yesterday.  Medical management of moderate disease in LAD and totally occluded Circumflex (good collaterals).   2. CAD: As above. Admitted with STEMI.  3. DM: Restart metformin today. (Was held for 48 hours post cath).   4. Hypokalemia: Potassium replaced yesterday. BMET pending this am.   5. Tobacco abuse: Smoking cessation has been advised. Nicotine patch in place.   6. Dispo: Home this am if she ambulates and feels well. She will follow up in our [place] office in 2 weeks. She needs an Effient starter packet and an excuse for work for this week. I would like for her to be off until she is seen in  the office in 2 weeks. F/U on BMET before d/c home-still pending at this time.      MCALHANY,CHRISTOPHER  8/29/20136:56 AM

## 2011-10-11 NOTE — Progress Notes (Signed)
CARDIAC REHAB PHASE I   PRE:  Rate/Rhythm: 55 SB asleep    BP: sitting 106/60    SaO2:   MODE:  Ambulation: 500 ft   POST:  Rate/Rhythm: 90 SR    BP: sitting 126/72     SaO2: 95 RA  Pt asleep on arrival, husband present. Very reluctant to walk. Made sarcastic comments. Eventually convinced her to ambulate with persistence. Pt demeanor about the same walking, sts she is tired in general. Multiple sighing, supposedly due to fatigue, but continued walking with increased distance and zero rest stops. HR to 90 SR. DOE. Family sts she has been fatigued like this for 8 mos. They laugh off her strange comments. Pt anxious to go home. [phone]  Harriet Masson CES, ACSM

## 2011-10-11 NOTE — Discharge Summary (Addendum)
CARDIOLOGY DISCHARGE SUMMARY   Patient ID: Nichole May MRN: [id number] DOB/AGE: [birth year] 61 y.o.  Admit date: 10/08/2011 Discharge date: 10/11/2011  Primary Discharge Diagnosis: Acute inferior STEMI - s/p 2.5 x 28 mm Promus Element DES to the RCA  Secondary Discharge Diagnosis:  Past Medical History  Diagnosis Date  . Diabetes mellitus   . Hypertension   . Tobacco abuse   . CAD (coronary artery disease)     10/08/11 Inf STEMI   Procedure Performed:  1. Left Heart Catheterization 2. Selective Coronary Angiography 3. Left ventricular angiogram 4. PTCA/DES RCA  5. 2D Echocardiogram  Hospital Course: Ms. Nichole May is a 61 year old female with no previous history of coronary artery disease. She went to [facility] emergency room where her ECG was consistent with an inferior ST elevation MI. She was transferred emergently to [facility] and taken directly to the Cath Lab.  The full cardiac catheterization results are below. She received a drug-eluting stent to the RCA. She was taken to CCU and admitted. She tolerated the procedure well. She was counseled on smoking cessation and started on a nicotine patch. She was seen by cardiac rehabilitation. She is on an ACE inhibitor and her metformin was held because of the heart catheterization. Her sugars were managed with sliding scale insulin.   She was bradycardic at times with a heart rate as low as 38. Therefore, a beta blocker was not used. Her blood sugars were under good control. She was mildly anemic post-procedure but this is felt secondary to blood draws and procedures. She should followup with her primary care physician for this. She should check stool guaiacs as an outpatient. She had been on an ACE inhibitor prior to admission and this was continued. A lipid profile showed dyslipidemia and a statin was added to her medication regimen. She had some elevation in her AST and ALT on admission but these improved prior to discharge and should  be followed as an outpatient. A 2-D echocardiogram showed left ventricular dysfunction with segmental wall motion abnormalities. She is to continue her ACE inhibitor and this will be followed as an outpatient as well.  On 10/11/2011, [name] was seen by Dr. Clifton James and by cardiac rehabilitation. Her potassium was low and was supplemented. She was ambulating without chest pain or shortness of breath and considered stable for discharge, to follow up as an outpatient.  Labs:   Lab Results  Component Value Date   WBC 7.6 10/11/2011   HGB 10.6* 10/11/2011   HCT 31.1* 10/11/2011   MCV 98.1 10/11/2011   PLT 171 10/11/2011    Lab 10/11/11 0503 10/10/11 0600  NA 140 --  K 3.1* --  CL 104 --  CO2 25 --  BUN 17 --  CREATININE 0.45* --  CALCIUM 8.6 --  PROT -- 5.8*  BILITOT -- 0.8  ALKPHOS -- 58  ALT -- 40*  AST -- 45*  GLUCOSE 128* --    Basename 10/09/11 0255 10/08/11 2021 10/08/11 1453  CKTOTAL 1412* 1924* 1985*  CKMB 32.7* 61.1* 90.0*  CKMBINDEX -- -- --  TROPONINI >20.00* >20.00* >20.00*   Lipid Panel     Component Value Date/Time   CHOL 135 10/09/2011 0255   TRIG 199* 10/09/2011 0255   HDL 33* 10/09/2011 0255   CHOLHDL 4.1 10/09/2011 0255   VLDL 40 10/09/2011 0255   LDLCALC 62 10/09/2011 0255    Basename 10/08/11 1204  INR 1.02      Radiology: Dg Chest Portable  1 View 10/08/2011  *RADIOLOGY REPORT*  Clinical Data: 61 year old female with chest pain.  Hypertension diabetes.  ST elevation myocardial infarction.  PORTABLE CHEST - 1 VIEW  Comparison: None.  Findings: Portable upright AP view 1210 hours.  Cardiac size at the upper limits of normal or mildly enlarged. Other mediastinal contours are within normal limits.  Visualized tracheal air column is within normal limits.  No pneumothorax, pulmonary edema, pleural effusion or airspace disease.  IMPRESSION: Mild cardiomegaly. No acute cardiopulmonary abnormality.   Original Report Authenticated By: Harley Hallmark, M.D.      Cardiac Cath: 10/08/2011 Left main: No obstructive disease noted.  Left Anterior Descending Artery: Large caliber vessel that courses to the apex. The mid LAD has a 70% stenosis at the takeoff of the small diagonal branch. The distal LAD has mild plaque disease.  Circumflex Artery: Moderate sized vessel with 100% occlusion in the mid segment just after the small first obtuse marginal branch. The distal Circumflex and second marginal branch fills from left to left collaterals.  Right Coronary Artery: 100% proximal occlusion.  Left Ventricular Angiogram: LVEF =40% with inferior wall hypokinesis.  Impression:  1. Acute inferior STEMI secondary to occluded proximal RCA, now s/p DES x 1 proximal RCA  2. Moderate disease in mid LAD  3. 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 Segmental LV systolic dysfunction.   EKG: 10-Oct-2011 07:41:00   ** Critical Test Result: STEMI Sinus bradycardia Inferior infarct , possibly acute T wave abnormality, consider anterolateral ischemia ** ** ACUTE MI / STEMI ** ** Consider right ventricular involvement in acute inferior infarct Abnormal ECG No significant change since last tracing Vent. rate 56 BPM PR interval 208 ms QRS duration 108 ms QT/QTc 444/428 ms P-R-T axes 76 32 148  Echo: 10/09/2011 Study Conclusions - Left ventricle: The cavity size was normal. Wall thickness was increased in a pattern of mild LVH. Systolic function was mildly to moderately reduced. The estimated ejection fraction was in the range of 40% to 45%. Basal to mid inferior and inferoseptal severe hypokinesis to akinesis. Basal to mid posterior severe hypokinesis. Doppler parameters are consistent with abnormal left ventricular relaxation (grade 1 diastolic dysfunction). - Aortic valve: Poorly visualized. Probably trileaflet. There was no stenosis. - Mitral valve: No significant regurgitation. - Right ventricle: The cavity size was mildly  dilated. Systolic function was mildly reduced. - Right atrium: The atrium was mildly dilated. - Pulmonary arteries: No complete TR doppler jet so unable to estimate PA systolic pressure. - Systemic veins: IVC measured 2.2 cm with some respirophasic variation, suggesting RA pressure 10 mmHg. Impressions: - Normal LV size with midl LV hypertrophy. EF 40-45% with basal to mid inferior, inferoseptal, and posterior severe hypokinesis to akinesis. Mildly dilated RV with mildly decreased RV systolic function. No significant valvular abnormalities.   FOLLOW UP PLANS AND APPOINTMENTS No Known Allergies Medication List  As of 10/11/2011  9:36 AM   TAKE these medications         lisinopril 20 MG tablet   Commonly known as: PRINIVIL,ZESTRIL   Take 20 mg by mouth daily.      metFORMIN 500 MG tablet   Commonly known as: GLUCOPHAGE   Take 500 mg by mouth 2 (two) times daily with a meal.      nicotine 21 mg/24hr patch   Commonly known as: NICODERM CQ - dosed in mg/24 hours   Place 1 patch onto the skin daily. NO tobacco while wearing patch.  nitroGLYCERIN 0.4 MG SL tablet   Commonly known as: NITROSTAT   Place 1 tablet (0.4 mg total) under the tongue every 5 (five) minutes x 3 doses as needed for chest pain.      prasugrel 10 MG Tabs   Commonly known as: EFFIENT   Take 1 tablet (10 mg total) by mouth daily.          Pt was also asked to take ASA 81 mg daily.  Discharge Orders    Future Appointments: Provider: Department: Dept Phone: Center:   10/25/2011 1:40 PM Jodelle Gross, NP [facility] [phone] [id number]     Future Orders Please Complete By Expires   Amb Referral to Cardiac Rehabilitation      Diet - low sodium heart healthy      Increase activity slowly        Follow-up Information    Follow up with Joni Reining, NP. (September 12th at 1:40 pm)    Contact information:   [address], [address] [place] [address] [phone]          BRING ALL MEDICATIONS WITH YOU TO FOLLOW UP APPOINTMENTS  Time spent with patient to include physician time: 35 min Signed: Theodore Demark 10/11/2011, 9:36 AM Co-Sign MD

## 2011-10-16 NOTE — Discharge Summary (Signed)
See full note by me. cdm

## 2011-10-23 ENCOUNTER — Encounter: Payer: Self-pay | Admitting: Adult Health

## 2011-10-25 ENCOUNTER — Ambulatory Visit (INDEPENDENT_AMBULATORY_CARE_PROVIDER_SITE_OTHER): Payer: PRIVATE HEALTH INSURANCE | Admitting: Adult Health

## 2011-10-25 ENCOUNTER — Encounter: Payer: Self-pay | Admitting: *Deleted

## 2011-10-25 ENCOUNTER — Encounter: Payer: Self-pay | Admitting: Adult Health

## 2011-10-25 VITALS — BP 100/54 | HR 59 | Ht 66.0 in | Wt 158.0 lb

## 2011-10-25 DIAGNOSIS — I2510 Atherosclerotic heart disease of native coronary artery without angina pectoris: Secondary | ICD-10-CM

## 2011-10-25 DIAGNOSIS — I519 Heart disease, unspecified: Secondary | ICD-10-CM

## 2011-10-25 NOTE — Assessment & Plan Note (Addendum)
She is without complaint today of recurrent discomfort in her chest shortness of breath, or weakness. She continues to have some fatigue, however. She states she is not yet ready to return to work and is requesting return to work letter for 11/12/2011. She will continue on DAPT, she will need to be placed also on a statin medication, as I do not see that she is taking one  on discharge. Review of labs this does show that she has hypercholesterolemia, LDL was 62. We will continue to monitor this for risk reduction. I have given her instructions on a low-cholesterol heart healthy diet. She is encouraged to continue smoking cessation.

## 2011-10-25 NOTE — Assessment & Plan Note (Signed)
Heart rate and blood pressure are well-controlled currently. She is not on a diuretic at this time. With the exception of HCTZ as a part of her inhibitor combination medicine. She is without complaint of dyspnea on exertion. She has stopped smoking. We will not make any changes at this time and see her again in one month.

## 2011-10-25 NOTE — Progress Notes (Signed)
   HPI: Nichole May is a 61 year old patient who is being est. in our office after admission to [place] in the setting of an ST elevation MI. Patient had no previous history of coronary artery disease, but went to the emergency room with an EKG consistent with inferior ST elevation. She was transferred emergently to [facility] and taken directly to the catheterization lab. Cardiac catheterization revealed occluded proximal right coronary artery. She had PCI with drug-eluting stent x1 to the proximal right coronary artery. She had moderate disease in the mid LAD of 70% at the takeoff of the small diagonal branch. She had chronic occlusion of circumflex with collateral filling. She had segmental LV systolic dysfunction with an EF of 45%.     She was subsequently placed on Effient 10 mg daily along with aspirin she was continued on her lisinopril HCTZ, and provided a prescription for nitroglycerin. She comes today without further complaint of chest pain or shortness of breath, she has quit smoking, she is interested in finding out more information on heart healthy diet. She works as a Lawyer, and still feels significant fatigue. She is mildly anxious concerning her diagnosis. No Known Allergies  Current Outpatient Prescriptions  Medication Sig Dispense Refill  . lisinopril-hydrochlorothiazide (PRINZIDE,ZESTORETIC) 20-12.5 MG per tablet Take 1 tablet by mouth daily.      . nicotine (NICODERM CQ - DOSED IN MG/24 HOURS) 21 mg/24hr patch Place 1 patch onto the skin daily. NO tobacco while wearing patch.  14 patch  0  . nitroGLYCERIN (NITROSTAT) 0.4 MG SL tablet Place 1 tablet (0.4 mg total) under the tongue every 5 (five) minutes x 3 doses as needed for chest pain.  25 tablet  3  . prasugrel (EFFIENT) 10 MG TABS Take 1 tablet (10 mg total) by mouth daily.  30 tablet  11  . metFORMIN (GLUCOPHAGE) 500 MG tablet Take 500 mg by mouth 2 (two) times daily with a meal.        Past Medical History    Diagnosis Date  . Diabetes mellitus   . Hypertension   . Tobacco abuse   . CAD (coronary artery disease)     10/08/11 Inf STEMI    Past Surgical History  Procedure Date  . Denies     JXB:JYNWGN of systems complete and found to be negative unless listed above  PHYSICAL EXAM BP 100/54  Pulse 59  Ht 5\' 6"  (1.676 m)  Wt 158 lb (71.668 kg)  BMI 25.50 kg/m2  General: Well developed, well nourished, in no acute distress Head: Eyes PERRLA, No xanthomas.   Normal cephalic and atramatic  Lungs: Clear bilaterally to auscultation and percussion, mild bibasilar crackles are noted. Heart: HRRR S1 S2, without MRG.  Pulses are 2+ & equal.            No carotid bruit. No JVD.  No abdominal bruits. No femoral bruits. Abdomen: Bowel sounds are positive, abdomen soft and non-tender without masses or                  Hernia's noted. Msk:  Back normal, normal gait. Normal strength and tone for age. Extremities: No clubbing, cyanosis or edema.  DP +1 Neuro: Alert and oriented X 3. Psych:  Good affect, responds appropriately  EKG: Sinus bradycardia, rate of 59 beats per minute, inferior T-wave abnormalities, with T-wave inversion noted with early repolarization low-voltage in inferior leads.  ASSESSMENT AND PLAN

## 2011-10-25 NOTE — Patient Instructions (Addendum)
Your physician recommends that you continue on your current medications as directed. Please refer to the Current Medication list given to you today.  Your physician recommends that you schedule a follow-up appointment in: 3 months with [facility].  You may return to work on September 30,2013 without any restrictions  Heart Health Diet This diet can help prevent heart disease and stroke. Many factors influence your heart health, including eating and exercise habits. Coronary risk rises a lot with abnormal blood fat (lipid) levels. Cardiac meal planning includes limiting unhealthy fats, increasing healthy fats, and making other small dietary changes. General guidelines are as follows:  Adjust calorie intake to reach and maintain desirable body weight.   Limit total fat intake to less than 30% of total calories. Saturated fat should be less than 7% of calories.   Saturated fats are found in animal products and in some vegetable products. Saturated vegetable fats are found in coconut oil, cocoa butter, palm oil, and palm kernel oil. Read labels carefully to avoid these products as much as possible. Use butter in moderation. Choose tub margarines and oils that have 2 grams of fat or less. Good cooking oils are canola and olive oils.   Practice low-fat cooking techniques. Do not fry food. Instead, broil, bake, boil, steam, grill, roast on a rack, stir-fry, or microwave it. Other fat reducing suggestions include:   Remove the skin from poultry.   Remove all visible fat from meats.   Skim the fat off stews, soups, and gravies before serving them.   Steam vegetables in water or broth instead of sauting them in fat.   Avoid foods with trans fat (or hydrogenated oils), such as commercially fried foods and commercially baked goods. Commercial shortening and deep-frying fats will contain trans fat.   Increase intake of fruits, vegetables, whole grains, and legumes to replace foods high in fat.    Increase consumption of nuts, legumes, and seeds to at least 4 servings weekly. One serving of a legume equals  cup, and 1 serving of nuts or seeds equals  cup.   Choose whole grains more often. Have 3 servings per day (a serving is 1 ounce [oz]).   Have at least 4 cups of fruit and vegetable a day.   Increase your intake of soluble fiber to 10 to 25 grams per day. Soluble fiber binds cholesterol to be removed from the blood. Foods high in soluble fiber are dried beans, citrus fruits, oats, apples, bananas, broccoli, Brussels sprouts, and eggplant.   Try to include foods fortified with plant sterols or stanols, such as yogurt, breads, juices, or margarines. Choose several fortified foods to achieve a daily intake of 2 to 3 grams of plant sterols or stanols.   Foods with omega-3 fats can help reduce your risk of heart disease. Aim to have a 3.5 oz portion of fatty fish twice per week, such as salmon, mackerel, albacore tuna, sardines, lake trout, or herring. If you wish to take a fish oil supplement, choose one that contains 1 gram of both DHA and EPA.   Limit processed meats to 2 servings (3 oz portion) weekly.   Limit the sodium in your diet to 1500 milligrams (mg) per day. If you have high blood pressure, talk to a registered dietitian about a DASH (Dietary Approaches to Stop Hypertension) eating plan.   Limit beverages with added sugar, such as soda, to no more than 36 ounces per week.  CHOOSING FOODS Starches  Allowed: Breads: All kinds (wheat,  rye, raisin, white, oatmeal, [place], [place], and English muffin bread). Low-fat rolls: English muffins, frankfurter and hamburger buns, bagels, pita bread, tortillas (not fried). Pancakes, waffles, biscuits, and muffins made with recommended oil.   Avoid: Products made with saturated or trans fats, oils, or whole milk products. Butter rolls, cheese breads, croissants. Commercial doughnuts, muffins, sweet rolls, biscuits, waffles, pancakes,  store-bought mixes.  Crackers  Allowed: Low-fat crackers and snacks: Animal, graham, rye, saltine (with recommended oil, no lard), oyster, and matzo crackers. Bread sticks, melba toast, rusks, flatbread, pretzels, and light popcorn.   Avoid: High-fat crackers: cheese crackers, butter crackers, and those made with coconut, palm oil, or trans fat (hydrogenated oils). Buttered popcorn.  Cereals  Allowed: Hot or cold whole-grain cereals.   Avoid: Cereals containing coconut, hydrogenated vegetable fat, or animal fat.  Potatoes / Pasta / Rice  Allowed: All kinds of potatoes, rice, and pasta (such as macaroni, spaghetti, and noodles).   Avoid: Pasta or rice prepared with cream sauce or high-fat cheese. Chow mein noodles, [place] fries.  Vegetables  Allowed: All vegetables and vegetable juices.   Avoid: Fried vegetables. Vegetables in cream, butter, or high-fat cheese sauces. Limit coconut. Fruit in cream or custard.  Meat and Meat Substitutes  Allowed: Limit your intake of meat, seafood, and poultry to no more than 6 oz (cooked weight) per day. All lean, well-trimmed beef, veal, pork, and lamb. All chicken and [place] without skin. All fish and shellfish. Wild game: wild duck, rabbit, pheasant, and venison. Meatless dishes: recipes with dried beans, peas, lentils, and tofu (soybean curd). Seeds and nuts: all seeds and most nuts.   Avoid: Prime grade and other heavily marbled and fatty meats, such as short ribs, spare ribs, rib eye roast or steak, frankfurters, sausage, bacon, and high-fat luncheon meats, mutton. Caviar. Commercially fried fish. Domestic duck, goose, venison sausage. Organ meats: liver, gizzard, heart, chitterlings, brains, kidney, sweetbreads.  Dairy  Allowed: Egg whites or low-cholesterol egg substitutes may be used as desired. Low-fat cheeses: nonfat or low-fat cottage cheese (1% or 2% fat), cheeses made with part skim milk, such as mozzarella, farmers, string, or ricotta.  (Cheeses should be labeled no more than 2 to 6 grams fat per oz.)   Avoid: Whole milk cheeses, including colby, cheddar, [place], [address], [place], [place], [place], [address], Swiss, and blue. Creamed cottage cheese, cream cheese.  Milk  Allowed: Skim (or 1%) milk: liquid, powdered, or evaporated. Buttermilk made with low-fat milk. Drinks made with skim or low-fat milk or cocoa. Chocolate milk or cocoa made with skim or low-fat (1%) milk. Nonfat or low-fat yogurt.   Avoid: Whole milk and whole milk products, including buttermilk or yogurt made from whole milk, drinks made from whole milk. Condensed milk, evaporated whole milk, and 2% milk.  Soups and Combination Foods  Allowed: Low-fat low-sodium soups: broth, dehydrated soups, homemade broth, soups with the fat removed, homemade cream soups made with skim or low-fat milk. Low-fat spaghetti, lasagna, chili, and Spanish rice if low-fat ingredients and low-fat cooking techniques are used.   Avoid: Cream soups made with whole milk, cream, or high-fat cheese. All other soups.  Desserts and Sweets  Allowed: Sherbet, fruit ices, gelatins, meringues, and angel food cake. Homemade desserts with recommended fats, oils, and milk products. Jam, jelly, honey, marmalade, sugars, and syrups. Pure sugar candy, such as gum drops, hard candy, jelly beans, marshmallows, mints, and small amounts of dark chocolate.   Avoid: Commercially prepared cakes, pies, cookies, frosting, pudding, or mixes for  these products. Desserts containing whole milk products, chocolate, coconut, lard, palm oil, or palm kernel oil. Ice cream or ice cream drinks. Candy that contains chocolate, coconut, butter, hydrogenated fat, or unknown ingredients. Buttered syrups.  Fats and Oils  Allowed: Vegetable oils: safflower, sunflower, corn, soybean, cottonseed, sesame, canola, olive, or peanut. Non-hydrogenated margarines. Salad dressing or mayonnaise: homemade or commercial, made with a  recommended oil. Low or nonfat salad dressing or mayonnaise.   Limit added fats and oils to 6 to 8 tsp per day (includes fats used in cooking, baking, salads, and spreads on bread). Remember to count the "hidden fats" in foods.   Avoid: Solid fats and shortenings: butter, lard, salt pork, bacon drippings. Gravy containing meat fat, shortening, or suet. Cocoa butter, coconut. Coconut oil, palm oil, palm kernel oil, or hydrogenated oils: these ingredients are often used in bakery products, nondairy creamers, whipped toppings, candy, and commercially fried foods. Read labels carefully. Salad dressings made of unknown oils, sour cream, or cheese, such as blue cheese and Roquefort. Cream, all kinds: half-and-half, light, heavy, or whipping. Sour cream or cream cheese (even if "light" or low-fat). Nondairy cream substitutes: coffee creamers and sour cream substitutes made with palm, palm kernel, hydrogenated oils, or coconut oil.  Beverages  Allowed: Coffee (regular or decaffeinated), tea. diet carbonated beverages, mineral water. Alcohol: Check with your caregiver. Moderation is recommended.   Avoid: Whole milk, regular sodas, and juice drinks with added sugar.  Condiments  Allowed: All seasonings and condiments. Cocoa powder. "Cream" sauces made with recommended ingredients.   Avoid: Carob powder made with hydrogenated fats.  SAMPLE MENU Breakfast   cup orange juice    cup oatmeal   1 slice toast   1 tsp margarine   1 cup skim milk  Lunch  [place] sandwich with 2 oz [place], 2 slices bread   Lettuce and tomato slices   Fresh fruit   Carrot sticks   Coffee or tea   Snack   Fresh fruit or low-fat crackers  Dinner  3 oz lean ground beef   1 baked potato   1 tsp margarine    cup asparagus   Lettuce salad   1 tbs non-creamy dressing    cup peach slices   1 cup skim milk  Document Released: 11/08/2007 Document Revised: 01/18/2011 Document Reviewed:  01/31/2010 [facility] Patient Information 2012 [place], [place].

## 2011-11-05 ENCOUNTER — Telehealth: Payer: Self-pay | Admitting: Adult Health

## 2011-11-05 NOTE — Telephone Encounter (Signed)
This is not on med list. Please advise as to wether or not you feel this is necessary for this patient.  [facility]

## 2011-11-05 NOTE — Discharge Summary (Signed)
This note was signed the day of discharge. cdm

## 2011-11-05 NOTE — Telephone Encounter (Signed)
PT CALLING TO KNOW IF SHE NEEDS TO BE TAKING A 81 MG ASPRIN EVERYDAY

## 2011-11-07 NOTE — Telephone Encounter (Signed)
Discharge summary indicates that she should be taking aspirin 81 mg per day

## 2011-11-08 NOTE — Telephone Encounter (Signed)
Advised patient to take aspirin 81 mg, per instruction.  Verbalizes understanding.

## 2011-12-15 ENCOUNTER — Encounter (HOSPITAL_COMMUNITY): Payer: Self-pay

## 2011-12-15 ENCOUNTER — Emergency Department (HOSPITAL_COMMUNITY)
Admission: EM | Admit: 2011-12-15 | Discharge: 2011-12-15 | Disposition: A | Discharge status: Home / Self Care | Payer: No Typology Code available for payment source | Attending: Emergency Medicine | Admitting: Emergency Medicine

## 2011-12-15 DIAGNOSIS — I10 Essential (primary) hypertension: Secondary | ICD-10-CM | POA: Insufficient documentation

## 2011-12-15 DIAGNOSIS — Y9389 Activity, other specified: Secondary | ICD-10-CM | POA: Insufficient documentation

## 2011-12-15 DIAGNOSIS — Y929 Unspecified place or not applicable: Secondary | ICD-10-CM | POA: Insufficient documentation

## 2011-12-15 DIAGNOSIS — I2510 Atherosclerotic heart disease of native coronary artery without angina pectoris: Secondary | ICD-10-CM | POA: Insufficient documentation

## 2011-12-15 DIAGNOSIS — E119 Type 2 diabetes mellitus without complications: Secondary | ICD-10-CM | POA: Insufficient documentation

## 2011-12-15 DIAGNOSIS — R079 Chest pain, unspecified: Secondary | ICD-10-CM | POA: Insufficient documentation

## 2011-12-15 DIAGNOSIS — Z79899 Other long term (current) drug therapy: Secondary | ICD-10-CM | POA: Insufficient documentation

## 2011-12-15 DIAGNOSIS — R51 Headache: Secondary | ICD-10-CM | POA: Insufficient documentation

## 2011-12-15 DIAGNOSIS — Z7982 Long term (current) use of aspirin: Secondary | ICD-10-CM | POA: Insufficient documentation

## 2011-12-15 DIAGNOSIS — Z87891 Personal history of nicotine dependence: Secondary | ICD-10-CM | POA: Insufficient documentation

## 2011-12-15 LAB — TROPONIN I
Troponin I: <0.3 ng/mL (ref <0.30)
Troponin I: <0.3 ng/mL (ref <0.30)

## 2011-12-15 MED ORDER — ACETAMINOPHEN 325 MG PO TABS
ORAL_TABLET | ORAL | Status: AC
  Administered 2011-12-15: 650 mg via ORAL
  Filled 2011-12-15: qty 2

## 2011-12-15 MED ORDER — ACETAMINOPHEN 325 MG PO TABS
650.0000 mg | ORAL_TABLET | Freq: Once | ORAL | Status: AC
  Administered 2011-12-15: 650 mg via ORAL
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
## 2011-12-15 NOTE — ED Notes (Signed)
Pt went to restroom but forgot to provide specimen.
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
## 2012-01-08 ENCOUNTER — Telehealth: Payer: Self-pay | Admitting: Adult Health

## 2012-01-08 NOTE — Telephone Encounter (Signed)
Will provide samples and apply for Rx Assistance

## 2012-01-08 NOTE — Telephone Encounter (Signed)
Patient states that she can not afford the Effient. / tg

## 2012-01-21 ENCOUNTER — Other Ambulatory Visit: Payer: Self-pay | Admitting: *Deleted

## 2012-02-01 ENCOUNTER — Telehealth: Payer: Self-pay | Admitting: *Deleted

## 2012-02-01 NOTE — Telephone Encounter (Signed)
Message left for patient to return call regarding Effient Samples and patient assistance information.

## 2012-02-18 ENCOUNTER — Ambulatory Visit (INDEPENDENT_AMBULATORY_CARE_PROVIDER_SITE_OTHER): Payer: BC Managed Care – PPO | Admitting: Adult Health

## 2012-02-18 ENCOUNTER — Encounter: Payer: Self-pay | Admitting: Adult Health

## 2012-02-18 ENCOUNTER — Other Ambulatory Visit: Payer: Self-pay | Admitting: Adult Health

## 2012-02-18 VITALS — BP 158/80 | HR 60 | Ht 65.0 in | Wt 156.0 lb

## 2012-02-18 DIAGNOSIS — D649 Anemia, unspecified: Secondary | ICD-10-CM

## 2012-02-18 DIAGNOSIS — I2510 Atherosclerotic heart disease of native coronary artery without angina pectoris: Secondary | ICD-10-CM

## 2012-02-18 DIAGNOSIS — I519 Heart disease, unspecified: Secondary | ICD-10-CM

## 2012-02-18 DIAGNOSIS — E119 Type 2 diabetes mellitus without complications: Secondary | ICD-10-CM

## 2012-02-18 MED ORDER — HYDROCHLOROTHIAZIDE 12.5 MG PO CAPS: 12.5000 mg | ORAL_CAPSULE | Freq: Every day | ORAL | Status: DC

## 2012-02-18 MED ORDER — METFORMIN HCL 500 MG PO TABS: 500.0000 mg | ORAL_TABLET | Freq: Every day | ORAL | Status: DC

## 2012-02-18 MED ORDER — LISINOPRIL 40 MG PO TABS: 40.0000 mg | ORAL_TABLET | Freq: Every day | ORAL | Status: DC
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
## 2012-02-18 NOTE — Progress Notes (Signed)
   HPI: Nichole May is a 62 y/o patient of Nichole May we are seeing for ongoing assessment and treatment of CAD with history of inferior ST elevation MI. Cardiac catheterization revealed occluded proximal right coronary artery. She had PCI with drug-eluting stent x1 to the proximal right coronary artery. She had moderate disease in the mid LAD of 70% at the takeoff of the small diagonal branch. She had chronic occlusion of circumflex with collateral filling. She had segmental LV systolic dysfunction with an EF of 45%.  She was subsequently placed on Effient 10 mg daily along with aspirin she was continued on her lisinopril HCTZ, and provided a prescription for nitroglycerin.   Since being seen last, she has stopped taking metformin. She states she has not seen her PCP, Nichole May and has no refills. She states she is taking her other medications daily. She is easily confused and has a friend with her to help her. She denies chest pain or DOE. She is easily anxious. 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    No Known Allergies  Current Outpatient Prescriptions  Medication Sig Dispense Refill  . aspirin 81 MG tablet Take 81 mg by mouth daily.      . nitroGLYCERIN (NITROSTAT) 0.4 MG SL tablet Place 1 tablet (0.4 mg total) under the tongue every 5 (five) minutes x 3 doses as needed for chest pain.  25 tablet  3  . prasugrel (EFFIENT) 10 MG TABS Take 1 tablet (10 mg total) by mouth daily.  30 tablet  11  . hydrochlorothiazide (MICROZIDE) 12.5 MG capsule Take 1 capsule (12.5 mg total) by mouth daily.  30 capsule  6  . lisinopril (PRINIVIL,ZESTRIL) 40 MG tablet Take 1 tablet (40 mg total) by mouth daily.  30 tablet  6  . metFORMIN (GLUCOPHAGE) 500 MG tablet Take 1 tablet (500 mg total) by mouth daily with breakfast.  30 tablet  0    Past Medical History  Diagnosis Date  . Diabetes mellitus   . Hypertension   . Tobacco abuse   . CAD (coronary artery disease)     10/08/11 Inf STEMI    Past Surgical History    Procedure Date  . Denies     ZOX:WRUEAV of systems complete and found to be negative unless listed above  PHYSICAL EXAM BP 158/80  Pulse 60  Ht 5\' 5"  (1.651 m)  Wt 156 lb (70.761 kg)  BMI 25.96 kg/m2  SpO2 98%  General: Well developed, well nourished, in no acute distress Head: Eyes PERRLA, No xanthomas.   Normal cephalic and atramatic  Lungs: Clear bilaterally to auscultation and percussion. Heart: HRRR S1 S2, without MRG.  Pulses are 2+ & equal.            No carotid bruit. No JVD.  No abdominal bruits. No femoral bruits. Abdomen: Bowel sounds are positive, abdomen soft and non-tender without masses or                  Hernia's noted. Msk:  Back normal, normal gait. Normal strength and tone for age. Extremities: No clubbing, cyanosis or edema.  DP +1 Neuro: Alert and oriented X 3. Psych:  Good affect, responds appropriately  EKG: NSR with anterior Q waves and T-Wave inversion.  ASSESSMENT AND PLAN

## 2012-02-18 NOTE — Progress Notes (Deleted)
Name: Nichole May    DOB: [birth year]  Age: 62 y.o.  MR#: [id number]       PCP:  Harlow Asa, MD      Insurance: @PAYORNAME @   CC:   No chief complaint on file.   VS BP 158/80  Pulse 60  Ht 5\' 5"  (1.651 m)  Wt 156 lb (70.761 kg)  BMI 25.96 kg/m2  SpO2 98%  Weights Current Weight  02/18/12 156 lb (70.761 kg)  12/15/11 152 lb (68.947 kg)  10/25/11 158 lb (71.668 kg)    Blood Pressure  BP Readings from Last 3 Encounters:  02/18/12 158/80  12/15/11 162/80  10/25/11 100/54     Admit date:  (Not on file) Last encounter with RMR:  01/08/2012   Allergy No Known Allergies  Current Outpatient Prescriptions  Medication Sig Dispense Refill  . aspirin 81 MG tablet Take 81 mg by mouth daily.      Marland Kitchen lisinopril-hydrochlorothiazide (PRINZIDE,ZESTORETIC) 20-12.5 MG per tablet Take 1 tablet by mouth daily.      . nitroGLYCERIN (NITROSTAT) 0.4 MG SL tablet Place 1 tablet (0.4 mg total) under the tongue every 5 (five) minutes x 3 doses as needed for chest pain.  25 tablet  3  . prasugrel (EFFIENT) 10 MG TABS Take 1 tablet (10 mg total) by mouth daily.  30 tablet  11    Discontinued Meds:    Medications Discontinued During This Encounter  Medication Reason  . metFORMIN (GLUCOPHAGE) 500 MG tablet Patient has not taken in last 30 days    Patient Active Problem List  Diagnosis  . Diabetes mellitus  . Tobacco abuse  . CAD (coronary artery disease)  . Systolic dysfunction    LABS Admission on 12/15/2011, Discharged on 12/15/2011  Component Date Value  . Troponin I 12/15/2011 <0.30   . Troponin I 12/15/2011 <0.30      Results for this Opt Visit:     Results for orders placed during the hospital encounter of 12/15/11  TROPONIN I      Component Value Range   Troponin I <0.30  <0.30 ng/mL  TROPONIN I      Component Value Range   Troponin I <0.30  <0.30 ng/mL    EKG Orders placed during the hospital encounter of 12/15/11  . ED EKG  . ED EKG  . EKG 12-LEAD  . EKG 12-LEAD    . EKG     Prior Assessment and Plan Problem List as of 02/18/2012            Cardiology Problems   CAD (coronary artery disease)   Last Assessment & Plan Note   10/25/2011 Office Visit Addendum 10/25/2011  2:52 PM by Jodelle Gross, NP    She is without complaint today of recurrent discomfort in her chest shortness of breath, or weakness. She continues to have some fatigue, however. She states she is not yet ready to return to work and is requesting return to work letter for 11/12/2011. She will continue on DAPT, she will need to be placed also on a statin medication, as I do not see that she is taking one  on discharge. Review of labs this does show that she has hypercholesterolemia, LDL was 62. We will continue to monitor this for risk reduction. I have given her instructions on a low-cholesterol heart healthy diet. She is encouraged to continue smoking cessation.      Other   Diabetes mellitus   Tobacco abuse   Systolic  dysfunction   Last Assessment & Plan Note   10/25/2011 Office Visit Signed 10/25/2011  3:17 PM by Jodelle Gross, NP    Heart rate and blood pressure are well-controlled currently. She is not on a diuretic at this time. With the exception of HCTZ as a part of her inhibitor combination medicine. She is without complaint of dyspnea on exertion. She has stopped smoking. We will not make any changes at this time and see her again in one month.        Imaging: No results found.   FRS Calculation: Score not calculated. Missing: Total Cholesterol

## 2012-02-18 NOTE — Patient Instructions (Addendum)
Your physician recommends that you schedule a follow-up appointment in: 6 months YOU HAVE AN APPOINTMENT WITH DR Gerda Diss ON Tuesday January 14TH AT 10 AM  Your physician has requested that you have an echocardiogram. Echocardiography is a painless test that uses sound waves to create images of your heart. It provides your doctor with information about the size and shape of your heart and how well your heart's chambers and valves are working. This procedure takes approximately one hour. There are no restrictions for this procedure.  Your physician recommends that you return for lab work in: Today  Your physician has recommended you make the following change in your medication:  1 - STOP Lisinopril/HCTZ 2 - START Lisinopril 40 mg daily along with 12.5 mg of HCTZ 3 - Metformin refill for 1 month sent in

## 2012-02-18 NOTE — Assessment & Plan Note (Signed)
She states that she ran out of her metformin and has no refills until being seen by Dr. Fletcher Anon office. She is having trouble making an appointment with them. We have made an appointment for Tuesday, January 14th for her. In the interim, I have given her a Rx for metformin 500 mg daily as she was taking. No refills are provided. A Hgb A1C is ordered. She will follow up with Dr. Fletcher Anon office for management.

## 2012-02-18 NOTE — Assessment & Plan Note (Signed)
Repeat echo for re-evaluation of systolic function.

## 2012-02-19 ENCOUNTER — Encounter: Payer: Self-pay | Admitting: *Deleted

## 2012-02-19 LAB — CBC
Hemoglobin: 12.1 g/dL (ref 12.0–15.0)
MCHC: 34.9 g/dL (ref 30.0–36.0)
RBC: 3.8 MIL/uL — ABNORMAL LOW (ref 3.87–5.11)

## 2012-02-19 LAB — BASIC METABOLIC PANEL
Glucose, Bld: 207 mg/dL — ABNORMAL HIGH (ref 70–99)
Potassium: 4 mEq/L (ref 3.5–5.3)
Sodium: 141 mEq/L (ref 135–145)

## 2012-02-19 LAB — HEMOGLOBIN A1C: Hgb A1c MFr Bld: 6.4 % — ABNORMAL HIGH (ref <5.7)

## 2012-02-21 ENCOUNTER — Ambulatory Visit (HOSPITAL_COMMUNITY)
Admission: RE | Admit: 2012-02-21 | Discharge: 2012-02-21 | Disposition: A | Discharge status: Home / Self Care | Payer: BC Managed Care – PPO | Source: Ambulatory Visit | Attending: Cardiology | Admitting: Cardiology

## 2012-02-21 DIAGNOSIS — I517 Cardiomegaly: Secondary | ICD-10-CM

## 2012-02-21 DIAGNOSIS — E119 Type 2 diabetes mellitus without complications: Secondary | ICD-10-CM | POA: Insufficient documentation

## 2012-02-21 DIAGNOSIS — I2510 Atherosclerotic heart disease of native coronary artery without angina pectoris: Secondary | ICD-10-CM | POA: Insufficient documentation

## 2012-02-21 DIAGNOSIS — D649 Anemia, unspecified: Secondary | ICD-10-CM

## 2012-02-21 NOTE — Progress Notes (Signed)
*  PRELIMINARY RESULTS* Echocardiogram 2D Echocardiogram has been performed.  Nichole May 02/21/2012, 1:48 PM

## 2012-04-11 ENCOUNTER — Telehealth: Payer: Self-pay | Admitting: Adult Health

## 2012-04-11 NOTE — Telephone Encounter (Signed)
HUSBAND ID CONCERNED ABOUT WIFE NOT BEING ABLE TO REMEMBER ANYTHING AFTER HER  HEART ATTACK. HE WAS WONDERING IF IT IS NORMAL.  PLEASE ONLY TALK TO HUSBAND ABOUT THIS. SHE CANNOT EVEN REMEMBER WHAT DAY IT IS.

## 2012-04-11 NOTE — Telephone Encounter (Signed)
Reviewed pt chart verbally with MD RR and was advised pt will need to be evaluated by her PCP concerning the memory loss due to pt MI should not have affected her memory in this way, spoke to pt husband whom will call PCP to have pt evaluated

## 2012-05-31 ENCOUNTER — Encounter (HOSPITAL_COMMUNITY): Payer: Self-pay

## 2012-05-31 ENCOUNTER — Emergency Department (HOSPITAL_COMMUNITY)
Admission: EM | Admit: 2012-05-31 | Discharge: 2012-05-31 | Disposition: A | Discharge status: Home / Self Care | Payer: BC Managed Care – PPO | Attending: Emergency Medicine | Admitting: Emergency Medicine

## 2012-05-31 DIAGNOSIS — I10 Essential (primary) hypertension: Secondary | ICD-10-CM | POA: Insufficient documentation

## 2012-05-31 DIAGNOSIS — Y939 Activity, unspecified: Secondary | ICD-10-CM | POA: Insufficient documentation

## 2012-05-31 DIAGNOSIS — Y929 Unspecified place or not applicable: Secondary | ICD-10-CM | POA: Insufficient documentation

## 2012-05-31 DIAGNOSIS — Z79899 Other long term (current) drug therapy: Secondary | ICD-10-CM | POA: Insufficient documentation

## 2012-05-31 DIAGNOSIS — S81811A Laceration without foreign body, right lower leg, initial encounter: Secondary | ICD-10-CM

## 2012-05-31 DIAGNOSIS — Z7982 Long term (current) use of aspirin: Secondary | ICD-10-CM | POA: Insufficient documentation

## 2012-05-31 DIAGNOSIS — E119 Type 2 diabetes mellitus without complications: Secondary | ICD-10-CM | POA: Insufficient documentation

## 2012-05-31 DIAGNOSIS — S81009A Unspecified open wound, unspecified knee, initial encounter: Secondary | ICD-10-CM | POA: Insufficient documentation

## 2012-05-31 DIAGNOSIS — Z87891 Personal history of nicotine dependence: Secondary | ICD-10-CM | POA: Insufficient documentation

## 2012-05-31 DIAGNOSIS — I2510 Atherosclerotic heart disease of native coronary artery without angina pectoris: Secondary | ICD-10-CM | POA: Insufficient documentation

## 2012-05-31 DIAGNOSIS — Z9861 Coronary angioplasty status: Secondary | ICD-10-CM | POA: Insufficient documentation

## 2012-05-31 DIAGNOSIS — W268XXA Contact with other sharp object(s), not elsewhere classified, initial encounter: Secondary | ICD-10-CM | POA: Insufficient documentation

## 2012-05-31 MED ORDER — SILVER NITRATE-POT NITRATE 75-25 % EX MISC
CUTANEOUS | Status: AC
  Filled 2012-05-31: qty 1

## 2012-05-31 MED ORDER — CEPHALEXIN 500 MG PO CAPS: 500.0000 mg | ORAL_CAPSULE | Freq: Two times a day (BID) | ORAL | Status: DC

## 2012-05-31 NOTE — ED Notes (Signed)
Wound cleaned, EDP applied quick clot to wound, bleeding controlled, applied telfa, bulky 4x4, kling, and coban. Circulation maintained, teaching administered and returned verbally by pt on wound care.

## 2012-05-31 NOTE — ED Notes (Signed)
Pt reports tripped over a wooden gate and cut r leg.  Pt reports is on a blood thinner and the cut has been bleeding off and on since last night.

## 2012-05-31 NOTE — ED Notes (Signed)
Pt alert & oriented x4, stable gait. Patient given discharge instructions, paperwork & prescription(s). Patient  instructed to stop at the registration desk to finish any additional paperwork. Patient verbalized understanding. Pt left department w/ no further questions. 

## 2012-05-31 NOTE — ED Provider Notes (Signed)
History     CSN: [id number]  Arrival date & time 05/31/12  1215   First MD Initiated Contact with Patient 05/31/12 1307      Chief Complaint  Patient presents with  . Laceration    (Consider location/radiation/quality/duration/timing/severity/associated sxs/prior treatment) Patient is a 62 y.o. female presenting with skin laceration. The history is provided by the patient (the pt cut her right calf yesterday and still has some bleeding).  Laceration Location: right calf. Depth:  Cutaneous Quality: avulsion   Bleeding: controlled with pressure   Laceration mechanism:  Blunt object Pain details:    Quality:  Aching   Past Medical History  Diagnosis Date  . Diabetes mellitus   . Hypertension   . Tobacco abuse   . CAD (coronary artery disease)     10/08/11 Inf STEMI    Past Surgical History  Procedure Laterality Date  . Denies    . Coronary angioplasty with stent placement      Family History  Problem Relation Age of Onset  . Heart attack Father     History  Substance Use Topics  . Smoking status: Former Smoker -- 0.50 packs/day for 15 years    Types: Cigarettes  . Smokeless tobacco: Not on file  . Alcohol Use: No    OB History   Grav Para Term Preterm Abortions TAB SAB Ect Mult Living                  Review of Systems  Constitutional: Negative for appetite change and fatigue.  HENT: Negative for congestion, sinus pressure and ear discharge.   Eyes: Negative for discharge.  Respiratory: Negative for cough.   Cardiovascular: Negative for chest pain.  Gastrointestinal: Negative for abdominal pain and diarrhea.  Genitourinary: Negative for frequency and hematuria.  Musculoskeletal: Negative for back pain.       Lac calf right  Skin: Negative for rash.  Neurological: Negative for seizures and headaches.  Psychiatric/Behavioral: Negative for hallucinations.    Allergies  Review of patient's allergies indicates no known allergies.  Home Medications    Current Outpatient Rx  Name  Route  Sig  Dispense  Refill  . acetaminophen (TYLENOL) 500 MG tablet   Oral   Take 500 mg by mouth every 6 (six) hours as needed for pain.         Marland Kitchen aspirin 81 MG tablet   Oral   Take 81 mg by mouth daily.         . hydrochlorothiazide (MICROZIDE) 12.5 MG capsule   Oral   Take 1 capsule (12.5 mg total) by mouth daily.   30 capsule   6   . lisinopril (PRINIVIL,ZESTRIL) 40 MG tablet   Oral   Take 1 tablet (40 mg total) by mouth daily.   30 tablet   6   . metFORMIN (GLUCOPHAGE) 500 MG tablet   Oral   Take 1 tablet (500 mg total) by mouth daily with breakfast.   30 tablet   0   . prasugrel (EFFIENT) 10 MG TABS   Oral   Take 1 tablet (10 mg total) by mouth daily.   30 tablet   11   . cephALEXin (KEFLEX) 500 MG capsule   Oral   Take 1 capsule (500 mg total) by mouth 2 (two) times daily.   10 capsule   0   . nitroGLYCERIN (NITROSTAT) 0.4 MG SL tablet   Sublingual   Place 1 tablet (0.4 mg total) under the tongue every  5 (five) minutes x 3 doses as needed for chest pain.   25 tablet   3     There were no vitals taken for this visit.  Physical Exam  Constitutional: She is oriented to person, place, and time. She appears well-developed.  HENT:  Head: Normocephalic.  Eyes: Conjunctivae are normal.  Neck: No tracheal deviation present.  Cardiovascular:  No murmur heard. Musculoskeletal: Normal range of motion.  2 cm lac right calf superficial.  Still bleeding  Neurological: She is oriented to person, place, and time.  Skin: Skin is warm.  Psychiatric: She has a normal mood and affect. Her behavior is normal.    ED Course  Procedures (including critical care time)  Labs Reviewed - No data to display No results found.   1. Laceration of calf without complication, right, initial encounter    Procedure.  Bleeding controled with silver nitrate sticks and wound seal MDM         Benny Lennert, MD 05/31/12 1345

## 2012-05-31 NOTE — ED Notes (Signed)
Jessica NT applied pressure bandage to wound to control bleeding during triage

## 2012-06-09 ENCOUNTER — Encounter: Payer: Self-pay | Admitting: *Deleted

## 2012-06-10 ENCOUNTER — Encounter: Payer: Self-pay | Admitting: Family Medicine

## 2012-06-10 ENCOUNTER — Ambulatory Visit (INDEPENDENT_AMBULATORY_CARE_PROVIDER_SITE_OTHER): Payer: BC Managed Care – PPO | Admitting: Family Medicine

## 2012-06-10 VITALS — BP 122/80 | Wt 159.6 lb

## 2012-06-10 DIAGNOSIS — L0390 Cellulitis, unspecified: Secondary | ICD-10-CM

## 2012-06-10 DIAGNOSIS — L0291 Cutaneous abscess, unspecified: Secondary | ICD-10-CM

## 2012-06-10 MED ORDER — AMOXICILLIN-POT CLAVULANATE 875-125 MG PO TABS: 1.0000 | ORAL_TABLET | Freq: Two times a day (BID) | ORAL | Status: AC

## 2012-06-10 MED ORDER — FREESTYLE LANCETS MISC: Status: DC

## 2012-06-10 MED ORDER — MUPIROCIN 2 % EX OINT: TOPICAL_OINTMENT | CUTANEOUS | Status: DC

## 2012-06-10 NOTE — Progress Notes (Signed)
  Subjective:    Patient ID: Nichole May, female    DOB: 1950-10-21, 62 y.o.   MRN: [id number]  HPI Took a fall. fri night. Half asleep. Fell on leg no sig hip or knee pain. Gashed leg.went to er.  Patient was placed on Keflex. Now that that has stopped redness is come back. No fever or chills. Review of Systems    ROS otherwise negative. Objective:   Physical Exam  Alert no acute distress. Vitals stable. Lungs clear. Heart regular in rhythm. Posterior calf laceration present with secondary ulceration some surrounding erythema and minimal tenderness.      Assessment & Plan:  Impression mild cellulitis at site of wound. Plan wound care discussed. Bactroban twice a day. Antibiotics prescribed. Followup for chronic visit. WSL

## 2012-06-24 ENCOUNTER — Encounter: Payer: Self-pay | Admitting: Family Medicine

## 2012-06-24 ENCOUNTER — Ambulatory Visit (INDEPENDENT_AMBULATORY_CARE_PROVIDER_SITE_OTHER): Payer: BC Managed Care – PPO | Admitting: Family Medicine

## 2012-06-24 VITALS — BP 142/88 | HR 80 | Ht 64.0 in | Wt 158.0 lb

## 2012-06-24 DIAGNOSIS — E1140 Type 2 diabetes mellitus with diabetic neuropathy, unspecified: Secondary | ICD-10-CM | POA: Insufficient documentation

## 2012-06-24 DIAGNOSIS — E119 Type 2 diabetes mellitus without complications: Secondary | ICD-10-CM

## 2012-06-24 DIAGNOSIS — E1149 Type 2 diabetes mellitus with other diabetic neurological complication: Secondary | ICD-10-CM

## 2012-06-24 DIAGNOSIS — E1142 Type 2 diabetes mellitus with diabetic polyneuropathy: Secondary | ICD-10-CM

## 2012-06-24 DIAGNOSIS — I10 Essential (primary) hypertension: Secondary | ICD-10-CM

## 2012-06-24 MED ORDER — METFORMIN HCL 500 MG PO TABS: 500.0000 mg | ORAL_TABLET | Freq: Two times a day (BID) | ORAL | Status: DC

## 2012-06-24 NOTE — Progress Notes (Signed)
  Subjective:    Patient ID: Nichole May, female    DOB: [birth year], 62 y.o.   MRN: [id number]  Diabetes She has type 2 diabetes mellitus. Her disease course has been stable. Hypoglycemia symptoms include dizziness. Pertinent negatives for hypoglycemia include no confusion. Pertinent negatives for diabetes include no blurred vision. Pertinent negatives for hypoglycemia complications include no blackouts. Symptoms are improving. Pertinent negatives for diabetic complications include no autonomic neuropathy. Risk factors for coronary artery disease include dyslipidemia. Current diabetic treatment includes diet and oral agent (monotherapy). She is compliant with treatment most of the time. Her home blood glucose trend is increasing steadily. Her breakfast blood glucose range is generally 140-180 mg/dl.   Patient has no chest pain from her coronary artery disease.  Patient claims compliance with her blood pressure medicine. Trying to walk some. Avoiding salt. No headache or chest pain.  Patient has tried to cut lipids and fats down in her diet.  She reports that her leg injury has improved. Review of Systems  Eyes: Negative for blurred vision.  Neurological: Positive for dizziness.  Psychiatric/Behavioral: Negative for confusion.   ROS otherwise negative    Results for orders placed in visit on 06/24/12  POCT GLYCOSYLATED HEMOGLOBIN (HGB A1C)      Result Value Range   Hemoglobin A1C 6.7      Objective:   Physical Exam  Alert HEENT normal. Lungs clear. Heart regular in rhythm. Abdomen soft. Extremities then. No edema. Distal sensation diminished. Arterial pulses good. No difficulty with reflexes.      Assessment & Plan:  Impression #1 type 2 diabetes A1c has risen somewhat. #2 diabetic neuropathy discussed. #3 hypertension good control. #4 leg injury improved. #5 coronary artery disease stable. Plan increase metformin to 500 twice a day. Rationale discussed. Diet exercise discussed.  Recheck as scheduled. WSL

## 2012-07-14 ENCOUNTER — Telehealth: Payer: Self-pay | Admitting: Family Medicine

## 2012-07-14 NOTE — Telephone Encounter (Signed)
Patient is running low on Accucheck test strips and needs a refill to [facility]. She has run low soon because she had to learn how to check it again.

## 2012-07-14 NOTE — Telephone Encounter (Signed)
Refill called into [facility] Patient notified

## 2012-07-24 ENCOUNTER — Encounter (HOSPITAL_COMMUNITY): Payer: Self-pay | Admitting: Dietician

## 2012-07-24 NOTE — Progress Notes (Signed)
[facility] Diabetes Class Completion  Date:July 24, 2012  Time: 1730  Pt attended [facility]'s Diabetes Group Education Class on July 24, 2012.   Patient was educated on the following topics: survival skills (signs and symptoms of hyperglycemia and hypoglycemia, treatment for hypoglycemia, ideal levels for fasting and postprandial blood sugars, goal Hgb A1c level, foot care basics), recommendations for physical activity, carbohydrate metabolism in relation to diabetes, and meal planning (sources of carbohydrate, carbohydrate counting, meal planning strategies, food label reading, and portion control).   Melody Haver, RD, LDN

## 2012-07-25 NOTE — Progress Notes (Signed)
Dietician sent it to you as a telephone message and routed it to you as a FYI

## 2012-07-25 NOTE — Progress Notes (Signed)
ok 

## 2012-07-25 NOTE — Progress Notes (Signed)
How did this get into pt calls?

## 2012-08-13 ENCOUNTER — Ambulatory Visit: Payer: BC Managed Care – PPO | Admitting: Adult Health

## 2012-08-22 ENCOUNTER — Encounter: Payer: Self-pay | Admitting: Adult Health

## 2012-08-22 ENCOUNTER — Ambulatory Visit (INDEPENDENT_AMBULATORY_CARE_PROVIDER_SITE_OTHER): Payer: BC Managed Care – PPO | Admitting: Adult Health

## 2012-08-22 VITALS — BP 132/80 | HR 81 | Ht 64.0 in | Wt 153.0 lb

## 2012-08-22 DIAGNOSIS — I10 Essential (primary) hypertension: Secondary | ICD-10-CM

## 2012-08-22 DIAGNOSIS — I519 Heart disease, unspecified: Secondary | ICD-10-CM

## 2012-08-22 DIAGNOSIS — I2510 Atherosclerotic heart disease of native coronary artery without angina pectoris: Secondary | ICD-10-CM

## 2012-08-22 DIAGNOSIS — I43 Cardiomyopathy in diseases classified elsewhere: Secondary | ICD-10-CM

## 2012-08-22 NOTE — Patient Instructions (Signed)
Your physician recommends that you schedule a follow-up appointment in: 1 month  Your physician recommends that you return for lab work TODAY.  Mag, BMET, CBC  Your physician recommends that you continue on your current medications as directed. Please refer to the Current Medication list given to you today.

## 2012-08-22 NOTE — Progress Notes (Signed)
HPI: Nichole May is a 62 year old patient of Dr. Dietrich Pates we are seeing for ongoing assessment and management of CAD, history of inferior ST elevation MI, with cardiac catheterization revealing occluded proximal right coronary artery. The patient had PCI with DES x1 to the proximal RCA. The patient had moderate disease in the mid LAD and 70% at the takeoff of a small diagonal branch. 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 EF was 45%.      The patient had been placed on dual antiplatelet therapy. She was last seen in the office in January of 2014. She is easily confused concerning her medications, and has a friend who is available to assist her with her medication dosages daily. Last visit she was stable from a cardiac standpoint. She was found to be mildly hypertensive with a blood pressure 158/80. Lisinopril was increased to 40 mg daily along with continuation of HCTZ at 12.5 mg daily a followup BMET was ordered. Review of labs revealed levels within normal limits. Medications were continued as directed.   Since being seen last, the patient has been feeling better, blood pressure is much better controlled. However she has complaints of frequent palpitations. States that she feels this every day usually going away on its own. She notices this most at nighttime. The patient does not excessively drink caffeine, but is drinking green tea several times a day that does have some caffeine in it. She has a friend who prepares her medications, and this has worked out well for her and she is able to take her medications as directed with good results. Otherwise she denies any cardiac symptoms.           No Known Allergies  Current Outpatient Prescriptions  Medication Sig Dispense Refill  . acetaminophen (TYLENOL) 500 MG tablet Take 500 mg by mouth every 6 (six) hours as needed for pain.      Marland Kitchen aspirin 81 MG tablet Take 81 mg by mouth daily.      . hydrochlorothiazide (MICROZIDE) 12.5  MG capsule Take 1 capsule (12.5 mg total) by mouth daily.  30 capsule  6  . Lancets (FREESTYLE) lancets Use as instructed  100 each  12  . lisinopril (PRINIVIL,ZESTRIL) 40 MG tablet Take 1 tablet (40 mg total) by mouth daily.  30 tablet  6  . metFORMIN (GLUCOPHAGE) 500 MG tablet Take 1 tablet (500 mg total) by mouth 2 (two) times daily with a meal.  60 tablet  11  . nitroGLYCERIN (NITROSTAT) 0.4 MG SL tablet Place 1 tablet (0.4 mg total) under the tongue every 5 (five) minutes x 3 doses as needed for chest pain.  25 tablet  3  . prasugrel (EFFIENT) 10 MG TABS Take 1 tablet (10 mg total) by mouth daily.  30 tablet  11   No current facility-administered medications for this visit.    Past Medical History  Diagnosis Date  . Diabetes mellitus   . Hypertension   . Tobacco abuse   . CAD (coronary artery disease)     10/08/11 Inf STEMI  . Hyperlipidemia     Past Surgical History  Procedure Laterality Date  . Denies    . Coronary angioplasty with stent placement      HQI:ONGEXB of systems complete and found to be negative unless listed above  PHYSICAL EXAM BP 132/80  Pulse 81  Ht 5\' 4"  (1.626 m)  Wt 153 lb (69.4 kg)  BMI 26.25 kg/m2  General: Well developed, well nourished,  in no acute distress Head: Eyes PERRLA, No xanthomas.   Normal cephalic and atramatic  Lungs: Clear bilaterally to auscultation and percussion. Heart: HRIR S1 S2 tachycardia, without MRG.  Pulses are 2+ & equal.            No carotid bruit. No JVD.  No abdominal bruits. No femoral bruits. Abdomen: Bowel sounds are positive, abdomen soft and non-tender without masses or                  Hernia's noted. Msk:  Back normal, normal gait. Normal strength and tone for age. Extremities: No clubbing, cyanosis or edema.  DP +1 Neuro: Alert and oriented X 3. Psych:  Good affect, responds appropriately  EKG: Sinus rhythm, frequent PACs, rate of 81 beats per minute  ASSESSMENT AND PLAN

## 2012-08-22 NOTE — Assessment & Plan Note (Signed)
Will reevaluate echocardiogram in 6 months. May consider adding low-dose beta blocker should her PACs become frequent and symptomatic for her. We will continue her on her current medications right now.
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
## 2012-08-22 NOTE — Assessment & Plan Note (Signed)
Chest no complaints of chest pain dyspnea on exertion or fatigue. She is complaining of some frequent palpitations. Review of EKG reveals sinus rhythm with frequent PACs. She is on HCTZ for blood pressure control without potassium replacement. Most recent potassium drawn was 3.5 in January of 2014. I will check a BMET to evaluate potassium status and give potassium replacement if necessary. A magnesium will also be drawn. She is given reassurance that PACs are very benign, and she should not become worried about having them.

## 2012-08-22 NOTE — Assessment & Plan Note (Signed)
Much better controlled currently as she has been taking her medications as directed she has had increased dose of lisinopril with use of HCTZ. She has a friend who has provided her medications and a daily dispenser and this has worked out well for her concerning compliance.

## 2012-08-23 LAB — CBC
HCT: 37 % (ref 36.0–46.0)
Hemoglobin: 12.5 g/dL (ref 12.0–15.0)
RBC: 4.09 MIL/uL (ref 3.87–5.11)
WBC: 7.2 10*3/uL (ref 4.0–10.5)

## 2012-08-23 LAB — BASIC METABOLIC PANEL
Glucose, Bld: 97 mg/dL (ref 70–99)
Potassium: 3.7 mEq/L (ref 3.5–5.3)
Sodium: 143 mEq/L (ref 135–145)

## 2012-08-23 LAB — MAGNESIUM: Magnesium: 2 mg/dL (ref 1.5–2.5)

## 2012-08-25 ENCOUNTER — Encounter: Payer: Self-pay | Admitting: *Deleted

## 2012-09-11 ENCOUNTER — Other Ambulatory Visit: Payer: Self-pay | Admitting: *Deleted

## 2012-09-11 ENCOUNTER — Telehealth: Payer: Self-pay | Admitting: *Deleted

## 2012-09-11 MED ORDER — PRASUGREL HCL 10 MG PO TABS: 10.0000 mg | ORAL_TABLET | Freq: Every day | ORAL | Status: DC

## 2012-09-11 MED ORDER — HYDROCHLOROTHIAZIDE 12.5 MG PO CAPS: 12.5000 mg | ORAL_CAPSULE | Freq: Every day | ORAL | Status: DC

## 2012-09-11 MED ORDER — LISINOPRIL 40 MG PO TABS: 40.0000 mg | ORAL_TABLET | Freq: Every day | ORAL | Status: DC

## 2012-09-11 NOTE — Telephone Encounter (Signed)
Pt needs effient called in to [facility]. Pt states she is having a hard time getting it filled. I do not see a request for it in system so I am assuming it may need pre-auth done.

## 2012-09-22 ENCOUNTER — Ambulatory Visit (INDEPENDENT_AMBULATORY_CARE_PROVIDER_SITE_OTHER): Payer: BC Managed Care – PPO | Admitting: Family Medicine

## 2012-09-22 ENCOUNTER — Encounter: Payer: Self-pay | Admitting: Family Medicine

## 2012-09-22 VITALS — BP 148/88 | Ht 66.0 in | Wt 150.0 lb

## 2012-09-22 DIAGNOSIS — E1140 Type 2 diabetes mellitus with diabetic neuropathy, unspecified: Secondary | ICD-10-CM

## 2012-09-22 DIAGNOSIS — E119 Type 2 diabetes mellitus without complications: Secondary | ICD-10-CM

## 2012-09-22 DIAGNOSIS — Z23 Encounter for immunization: Secondary | ICD-10-CM

## 2012-09-22 DIAGNOSIS — E1142 Type 2 diabetes mellitus with diabetic polyneuropathy: Secondary | ICD-10-CM

## 2012-09-22 DIAGNOSIS — I519 Heart disease, unspecified: Secondary | ICD-10-CM

## 2012-09-22 DIAGNOSIS — I2510 Atherosclerotic heart disease of native coronary artery without angina pectoris: Secondary | ICD-10-CM

## 2012-09-22 DIAGNOSIS — E1149 Type 2 diabetes mellitus with other diabetic neurological complication: Secondary | ICD-10-CM

## 2012-09-22 NOTE — Progress Notes (Signed)
  Subjective:    Patient ID: Nichole May, female    DOB: Apr 12, 1950, 62 y.o.   MRN: [id number]  Diabetes She presents for her follow-up diabetic visit. She has type 2 diabetes mellitus. Her disease course has been fluctuating. Pertinent negatives for diabetes include no blurred vision and no chest pain. There are no hypoglycemic complications. Symptoms are improving. Risk factors for coronary artery disease include diabetes mellitus. She is following a generally healthy diet. Meal planning includes avoidance of concentrated sweets. She has not had a previous visit with a dietician. She participates in exercise daily. Her home blood glucose trend is decreasing steadily. Her breakfast blood glucose is taken between 7-8 am. Her breakfast blood glucose range is generally 110-130 mg/dl. An ACE inhibitor/angiotensin II receptor blocker is being taken. Eye exam is current.   Results for orders placed in visit on 09/22/12  POCT GLYCOSYLATED HEMOGLOBIN (HGB A1C)      Result Value Range   Hemoglobin A1C 5.9     Patient claims compliance with medication for blood pressure. No obvious side effects.  Walking regularly.  No chest pain. Has required no nitroglycerin.  Review of Systems  Eyes: Negative for blurred vision.  Cardiovascular: Negative for chest pain.   ROS otherwise negative    Objective:   Physical Exam  Alert no acute distress. Lungs clear. Heart regular rate and rhythm. HEENT normal. Feet without edema pulses good sensation intact.      Assessment & Plan:  Impression type 2 diabetes good control. #2 hypertension good control. #3 coronary artery disease clinically silent at this time. #4 history of diabetic neuropathy absent at this time by exam. Plan maintain same meds. Diet exercise discussed. Check in 6 months. WSL

## 2012-09-25 ENCOUNTER — Ambulatory Visit (INDEPENDENT_AMBULATORY_CARE_PROVIDER_SITE_OTHER): Payer: BC Managed Care – PPO | Admitting: Adult Health

## 2012-09-25 ENCOUNTER — Encounter: Payer: Self-pay | Admitting: Adult Health

## 2012-09-25 VITALS — BP 163/84 | HR 71 | Ht 66.0 in | Wt 149.0 lb

## 2012-09-25 DIAGNOSIS — I10 Essential (primary) hypertension: Secondary | ICD-10-CM

## 2012-09-25 DIAGNOSIS — I2510 Atherosclerotic heart disease of native coronary artery without angina pectoris: Secondary | ICD-10-CM

## 2012-09-25 NOTE — Progress Notes (Deleted)
Name: Nichole May    DOB: 1950-11-13  Age: 62 y.o.  MR#: [id number]       PCP:  Harlow Asa, MD      Insurance: Payor: BLUE CROSS BLUE SHIELD / Plan: BCBS [place] PPO / Product Type: *No Product type* /   CC:    Chief Complaint  Patient presents with  . Coronary Artery Disease  . Hypertension    VS Filed Vitals:   09/25/12 1411  BP: 163/84  Pulse: 71  Height: 5\' 6"  (1.676 m)  Weight: 149 lb 0.6 oz (67.604 kg)    Weights Current Weight  09/25/12 149 lb 0.6 oz (67.604 kg)  09/22/12 150 lb (68.04 kg)  08/22/12 153 lb (69.4 kg)    Blood Pressure  BP Readings from Last 3 Encounters:  09/25/12 163/84  09/22/12 148/88  08/22/12 132/80     Admit date:  (Not on file) Last encounter with RMR:  08/22/2012   Allergy Review of patient's allergies indicates no known allergies.  Current Outpatient Prescriptions  Medication Sig Dispense Refill  . ACCU-CHEK AVIVA PLUS test strip       . acetaminophen (TYLENOL) 500 MG tablet Take 500 mg by mouth every 6 (six) hours as needed for pain.      Marland Kitchen aspirin 81 MG tablet Take 81 mg by mouth daily.      . hydrochlorothiazide (MICROZIDE) 12.5 MG capsule Take 1 capsule (12.5 mg total) by mouth daily.  30 capsule  1  . Lancets (FREESTYLE) lancets Use as instructed  100 each  12  . lisinopril (PRINIVIL,ZESTRIL) 40 MG tablet Take 1 tablet (40 mg total) by mouth daily.  30 tablet  1  . metFORMIN (GLUCOPHAGE) 500 MG tablet Take 1 tablet (500 mg total) by mouth 2 (two) times daily with a meal.  60 tablet  11  . nitroGLYCERIN (NITROSTAT) 0.4 MG SL tablet Place 1 tablet (0.4 mg total) under the tongue every 5 (five) minutes x 3 doses as needed for chest pain.  25 tablet  3  . prasugrel (EFFIENT) 10 MG TABS Take 1 tablet (10 mg total) by mouth daily.  30 tablet  11   No current facility-administered medications for this visit.    Discontinued Meds:   There are no discontinued medications.  Patient Active Problem List   Diagnosis Date Noted  . Diabetic  neuropathy 06/24/2012  . Essential hypertension, benign 06/24/2012  . Systolic dysfunction 10/25/2011  . Diabetes mellitus 10/09/2011  . Tobacco abuse 10/09/2011  . CAD (coronary artery disease) 10/09/2011    LABS    Component Value Date/Time   NA 143 08/22/2012 1605   NA 141 02/18/2012 1444   NA 140 10/11/2011 0503   K 3.7 08/22/2012 1605   K 4.0 02/18/2012 1444   K 3.1* 10/11/2011 0503   CL 102 08/22/2012 1605   CL 104 02/18/2012 1444   CL 104 10/11/2011 0503   CO2 27 08/22/2012 1605   CO2 32 02/18/2012 1444   CO2 25 10/11/2011 0503   GLUCOSE 97 08/22/2012 1605   GLUCOSE 207* 02/18/2012 1444   GLUCOSE 128* 10/11/2011 0503   BUN 11 08/22/2012 1605   BUN 20 02/18/2012 1444   BUN 17 10/11/2011 0503   CREATININE 0.52 08/22/2012 1605   CREATININE 0.53 02/18/2012 1444   CREATININE 0.45* 10/11/2011 0503   CREATININE 0.42* 10/10/2011 0600   CREATININE 0.43* 10/09/2011 0255   CALCIUM 9.7 08/22/2012 1605   CALCIUM 9.2 02/18/2012 1444   CALCIUM  8.6 10/11/2011 0503   GFRNONAA >90 10/11/2011 0503   GFRNONAA >90 10/10/2011 0600   GFRNONAA >90 10/09/2011 0255   GFRAA >90 10/11/2011 0503   GFRAA >90 10/10/2011 0600   GFRAA >90 10/09/2011 0255   CMP     Component Value Date/Time   NA 143 08/22/2012 1605   K 3.7 08/22/2012 1605   CL 102 08/22/2012 1605   CO2 27 08/22/2012 1605   GLUCOSE 97 08/22/2012 1605   BUN 11 08/22/2012 1605   CREATININE 0.52 08/22/2012 1605   CREATININE 0.45* 10/11/2011 0503   CALCIUM 9.7 08/22/2012 1605   PROT 5.8* 10/10/2011 0600   ALBUMIN 3.0* 10/10/2011 0600   AST 45* 10/10/2011 0600   ALT 40* 10/10/2011 0600   ALKPHOS 58 10/10/2011 0600   BILITOT 0.8 10/10/2011 0600   GFRNONAA >90 10/11/2011 0503   GFRAA >90 10/11/2011 0503       Component Value Date/Time   WBC 7.2 08/22/2012 1605   WBC 6.5 02/18/2012 1444   WBC 7.6 10/11/2011 0503   HGB 12.5 08/22/2012 1605   HGB 12.1 02/18/2012 1444   HGB 10.6* 10/11/2011 0503   HCT 37.0 08/22/2012 1605   HCT 34.7* 02/18/2012 1444   HCT 31.1* 10/11/2011 0503    MCV 90.5 08/22/2012 1605   MCV 91.3 02/18/2012 1444   MCV 98.1 10/11/2011 0503    Lipid Panel     Component Value Date/Time   CHOL 135 10/09/2011 0255   TRIG 199* 10/09/2011 0255   HDL 33* 10/09/2011 0255   CHOLHDL 4.1 10/09/2011 0255   VLDL 40 10/09/2011 0255   LDLCALC 62 10/09/2011 0255    ABG No results found for this basename: phart, pco2, pco2art, po2, po2art, hco3, tco2, acidbasedef, o2sat     Lab Results  Component Value Date   TSH 1.316 10/08/2011   BNP (last 3 results) No results found for this basename: PROBNP,  in the last 8760 hours Cardiac Panel (last 3 results) No results found for this basename: CKTOTAL, CKMB, TROPONINI, RELINDX,  in the last 72 hours  Iron/TIBC/Ferritin No results found for this basename: iron, tibc, ferritin     EKG Orders placed in visit on 08/22/12  . EKG 12-LEAD     Prior Assessment and Plan Problem List as of 09/25/2012     Cardiovascular and Mediastinum   CAD (coronary artery disease)   Last Assessment & Plan   08/22/2012 Office Visit Written 08/22/2012  4:45 PM by Jodelle Gross, NP     Chest no complaints of chest pain dyspnea on exertion or fatigue. She is complaining of some frequent palpitations. Review of EKG reveals sinus rhythm with frequent PACs. She is on HCTZ for blood pressure control without potassium replacement. Most recent potassium drawn was 3.5 in January of 2014. I will check a BMET to evaluate potassium status and give potassium replacement if necessary. A magnesium will also be drawn. She is given reassurance that PACs are very benign, and she should not become worried about having them.    Essential hypertension, benign   Last Assessment & Plan   08/22/2012 Office Visit Written 08/22/2012  4:46 PM by Jodelle Gross, NP     Much better controlled currently as she has been taking her medications as directed she has had increased dose of lisinopril with use of HCTZ. She has a friend who has provided her medications and  a daily dispenser and this has worked out well for her concerning compliance.  Endocrine   Diabetes mellitus   Last Assessment & Plan   02/18/2012 Office Visit Written 02/18/2012  2:11 PM by Jodelle Gross, NP     She states that she ran out of her metformin and has no refills until being seen by Dr. Fletcher Anon office. She is having trouble making an appointment with them. We have made an appointment for Tuesday, January 14th for her. In the interim, I have given her a Rx for metformin 500 mg daily as she was taking. No refills are provided. A Hgb A1C is ordered. She will follow up with Dr. Fletcher Anon office for management.      Nervous and Auditory   Diabetic neuropathy     Other   Tobacco abuse   Systolic dysfunction   Last Assessment & Plan   08/22/2012 Office Visit Written 08/22/2012  4:47 PM by Jodelle Gross, NP     Will reevaluate echocardiogram in 6 months. May consider adding low-dose beta blocker should her PACs become frequent and symptomatic for her. We will continue her on her current medications right now.        Imaging: No results found.

## 2012-09-25 NOTE — Assessment & Plan Note (Signed)
She is doing well and without cardiac complaint. She remains active. Will continue risk management. She will be seen in one year unless symptomatic. She will continue current medications without changes. Refills will be provided.

## 2012-09-25 NOTE — Progress Notes (Signed)
HPI: Nichole May is a 62 year old patient of Dr. Dietrich Pates we are following for ongoing assessment and management of CAD, history of inferior ST elevation MI, with cardiac catheterization revealing proximal right coronary artery occlusion. The patient had PCI with DES x1 to the proximal RCA. She had moderate disease in the mid LAD with a stent percent at the takeoff of the small diagonal branch. She is chronic occlusion of the circumflex with collateral filling. EF was 45%. She remains on dual antiplatelet therapy. Last visit the patient had complaints of frequent palpitations. She noticed them most at nighttime. On last visit I checked a BMET to evaluate potassium status, and magnesium. Given reassurance concerning palpitations.   Sodium was 143 potassium 3.7 chloride 102 CO2 27 BUN 11 creatinine 0.52 with a magnesium of 2.0.  She comes today without further complaints of palpitations, chest pain or DOE. She remains active and medically compliant.     No Known Allergies  Current Outpatient Prescriptions  Medication Sig Dispense Refill  . ACCU-CHEK AVIVA PLUS test strip       . acetaminophen (TYLENOL) 500 MG tablet Take 500 mg by mouth every 6 (six) hours as needed for pain.      Marland Kitchen aspirin 81 MG tablet Take 81 mg by mouth daily.      . hydrochlorothiazide (MICROZIDE) 12.5 MG capsule Take 1 capsule (12.5 mg total) by mouth daily.  30 capsule  1  . Lancets (FREESTYLE) lancets Use as instructed  100 each  12  . lisinopril (PRINIVIL,ZESTRIL) 40 MG tablet Take 1 tablet (40 mg total) by mouth daily.  30 tablet  1  . metFORMIN (GLUCOPHAGE) 500 MG tablet Take 1 tablet (500 mg total) by mouth 2 (two) times daily with a meal.  60 tablet  11  . nitroGLYCERIN (NITROSTAT) 0.4 MG SL tablet Place 1 tablet (0.4 mg total) under the tongue every 5 (five) minutes x 3 doses as needed for chest pain.  25 tablet  3  . prasugrel (EFFIENT) 10 MG TABS Take 1 tablet (10 mg total) by mouth daily.  30 tablet  11   No  current facility-administered medications for this visit.    Past Medical History  Diagnosis Date  . Diabetes mellitus   . Hypertension   . Tobacco abuse   . CAD (coronary artery disease)     10/08/11 Inf STEMI  . Hyperlipidemia     Past Surgical History  Procedure Laterality Date  . Denies    . Coronary angioplasty with stent placement      ROS: Review of systems complete and found to be negative unless listed above  PHYSICAL EXAM BP 163/84  Pulse 71  Ht 5\' 6"  (1.676 m)  Wt 149 lb 0.6 oz (67.604 kg)  BMI 24.07 kg/m2  General: Well developed, well nourished, in no acute distress Head: Eyes PERRLA, No xanthomas.   Normal cephalic and atramatic  Lungs: Clear bilaterally to auscultation and percussion. Heart: HRRR S1 S2, without MRG.  Pulses are 2+ & equal.            No carotid bruit. No JVD.  No abdominal bruits. No femoral bruits. Abdomen: Bowel sounds are positive, abdomen soft and non-tender without masses or                  Hernia's noted. Msk:  Back normal, normal gait. Normal strength and tone for age. Extremities: No clubbing, cyanosis or edema.  DP +1 Neuro: Alert and oriented X 3.  Psych:  Good affect, responds appropriately    ASSESSMENT AND PLAN

## 2012-09-25 NOTE — Patient Instructions (Signed)
Your physician recommends that you schedule a follow-up appointment in: 1 year You will receive a reminder letter two months in advance reminding you to call and schedule your appointment. If you don't receive this letter, please contact our office.  Your physician recommends that you continue on your current medications as directed. Please refer to the Current Medication list given to you today.   

## 2012-09-25 NOTE — Assessment & Plan Note (Signed)
Well controlled currently. She will continue Heart Healthy diet and risk management. Will see her in one year unless symptomatic.

## 2012-11-10 ENCOUNTER — Other Ambulatory Visit: Payer: Self-pay | Admitting: *Deleted

## 2012-11-10 MED ORDER — LISINOPRIL 40 MG PO TABS: 40.0000 mg | ORAL_TABLET | Freq: Every day | ORAL | Status: DC

## 2012-11-14 ENCOUNTER — Other Ambulatory Visit: Payer: Self-pay | Admitting: *Deleted

## 2012-11-14 MED ORDER — HYDROCHLOROTHIAZIDE 12.5 MG PO CAPS: 12.5000 mg | ORAL_CAPSULE | Freq: Every day | ORAL | Status: DC

## 2012-12-22 ENCOUNTER — Ambulatory Visit: Payer: BC Managed Care – PPO | Admitting: Family Medicine

## 2012-12-22 ENCOUNTER — Telehealth: Payer: Self-pay | Admitting: Family Medicine

## 2012-12-22 NOTE — Telephone Encounter (Signed)
Pts sister who has HIPPA release is calling to see what you think about her [name]'s mouth being droopy on the right side since Saturday morning, no other pain noticeable at this time. Can speak clearly, no slurring, seems alert an oriented. Please advise [phone] after... 2 pm pt cant come to OV today till after 4pm if that's what the doc wants

## 2012-12-22 NOTE — Telephone Encounter (Signed)
Spoke with Lannette Donath regarding patient-she spoke with Dr Brett Canales who approved an appointment for today this afternoon.

## 2012-12-23 ENCOUNTER — Encounter: Payer: Self-pay | Admitting: Family Medicine

## 2012-12-23 ENCOUNTER — Ambulatory Visit (INDEPENDENT_AMBULATORY_CARE_PROVIDER_SITE_OTHER): Payer: BC Managed Care – PPO | Admitting: Family Medicine

## 2012-12-23 VITALS — BP 134/98 | Ht 66.0 in | Wt 148.8 lb

## 2012-12-23 DIAGNOSIS — G510 Bell's palsy: Secondary | ICD-10-CM | POA: Insufficient documentation

## 2012-12-23 MED ORDER — VALACYCLOVIR HCL 1 G PO TABS: ORAL_TABLET | ORAL | Status: AC

## 2012-12-23 NOTE — Patient Instructions (Signed)
Bell's Palsy  Bell's palsy is a condition in which the muscles on one side of the face cannot move (paralysis). This is because the nerves in the face are paralyzed. It is most often thought to be caused by a virus. The virus causes swelling of the nerve that controls movement on one side of the face. The nerve travels through a tight space surrounded by bone. When the nerve swells, it can be compressed by the bone. This results in damage to the protective covering around the nerve. This damage interferes with how the nerve communicates with the muscles of the face. As a result, it can cause weakness or paralysis of the facial muscles.   Injury (trauma), tumor, and surgery may cause Bell's palsy, but most of the time the cause is unknown. It is a relatively common condition. It starts suddenly (abrupt onset) with the paralysis usually ending within 2 days. Bell's palsy is not dangerous. But because the eye does not close properly, you may need care to keep the eye from getting dry. This can include splinting (to keep the eye shut) or moistening with artificial tears. Bell's palsy very seldom occurs on both sides of the face at the same time.  SYMPTOMS    Eyebrow sagging.   Drooping of the eyelid and corner of the mouth.   Inability to close one eye.   Loss of taste on the front of the tongue.   Sensitivity to loud noises.  TREATMENT   The treatment is usually non-surgical. If the patient is seen within the first 24 to 48 hours, a short course of steroids may be prescribed, in an attempt to shorten the length of the condition. Antiviral medicines may also be used with the steroids, but it is unclear if they are helpful.   You will need to protect your eye, if you cannot close it. The cornea (clear covering over your eye) will become dry and can be damaged. Artificial tears can be used to keep your eye moist. Glasses or an eye patch should be worn to protect your eye.  PROGNOSIS   Recovery is variable, ranging  from days to months. Although the problem usually goes away completely (about 80% of cases resolve), predicting the outcome is impossible. Most people improve within 3 weeks of when the symptoms began. Improvement may continue for 3 to 6 months. A small number of people have moderate to severe weakness that is permanent.   HOME CARE INSTRUCTIONS    If your caregiver prescribed medication to reduce swelling in the nerve, use as directed. Do not stop taking the medication unless directed by your caregiver.   Use moisturizing eye drops as needed to prevent drying of your eye, as directed by your caregiver.   Protect your eye, as directed by your caregiver.   Use facial massage and exercises, as directed by your caregiver.   Perform your normal activities, and get your normal rest.  SEEK IMMEDIATE MEDICAL CARE IF:    There is pain, redness or irritation in the eye.   You or your child has an oral temperature above 102 F (38.9 C), not controlled by medicine.  MAKE SURE YOU:    Understand these instructions.   Will watch your condition.   Will get help right away if you are not doing well or get worse.  Document Released: 01/29/2005 Document Revised: 04/23/2011 Document Reviewed: 02/07/2009  [employer] Patient Information 2014 [employer].

## 2012-12-23 NOTE — Progress Notes (Signed)
  Subjective:    Patient ID: Nichole May, female    DOB: 02-06-1951, 62 y.o.   MRN: [id number]  HPI Patient is here today for right-sided facial droopiness. This first happened on Saturday. She denies any pain or any other symptoms.   She states it has gotten better since Saturday.   No weakness elsewhere. No trouble speaking no trouble walking no trouble with chest pain or abdominal pain.  Patient does get fever blisters but none recently.  Compliant with medications.   Review of Systems No headache no sore throat no fever ROS otherwise negative    Objective:   Physical Exam  Alert right-sided facial weakness noted. Sensation intact chronic pulses good pharynx normal TMs normal fundi discharge lungs clear heart regular in rhythm cerebellar function intact hand grips intact sensation intact strength intact.      Assessment & Plan:  Impression 1 Bell's palsy discussed at length including increased risk with history of diabetic neuropathy, rationale discussed for holding off on therapy. Potential for diminished recovery discussed at length. Plan Valtrex 3 times a day 7 days. Artificial tears. Recheck in several weeks. Warning signs discussed. 25 minutes spent most in discussion and education. WSL

## 2013-01-14 ENCOUNTER — Ambulatory Visit (INDEPENDENT_AMBULATORY_CARE_PROVIDER_SITE_OTHER): Payer: BC Managed Care – PPO | Admitting: Family Medicine

## 2013-01-14 ENCOUNTER — Encounter: Payer: Self-pay | Admitting: Family Medicine

## 2013-01-14 VITALS — BP 138/88 | Ht 66.0 in | Wt 151.8 lb

## 2013-01-14 DIAGNOSIS — G510 Bell's palsy: Secondary | ICD-10-CM

## 2013-01-14 NOTE — Progress Notes (Signed)
   Subjective:    Patient ID: MARCILLE BARMAN, female    DOB: 12/31/1950, 62 y.o.   MRN: [id number]  HPI Patient is here today for a f/u on the Ball's Palsy.   She states she feels a lot better, however, her right eye is still watery. She says she has never had this problem in the past.   No difficulty with vision in the right eye.  Reports that her facial strength is improving. Family agrees with this. No difficulty swallowing.    Review of Systems No headache no throat pain no chest pain no shortness of breath ROS otherwise negative    Objective:   Physical Exam  Alert no apparent distress. Lungs clear heart regular in rhythm HET right-sided facial tone has definitely improved. Still some palsy lingering right ocular exam within normal limits      Assessment & Plan:  Impression Bell's palsy improved right high residuals symptoms but improving discussed plan maintain same treatment followup regular appointment. Warning signs discussed. WSL

## 2013-04-07 ENCOUNTER — Ambulatory Visit: Payer: BC Managed Care – PPO | Admitting: Family Medicine

## 2013-04-13 ENCOUNTER — Ambulatory Visit: Payer: BC Managed Care – PPO | Admitting: Family Medicine

## 2013-04-13 ENCOUNTER — Other Ambulatory Visit: Payer: Self-pay

## 2013-04-13 MED ORDER — NITROGLYCERIN 0.4 MG SL SUBL: 0.4000 mg | SUBLINGUAL_TABLET | SUBLINGUAL | Status: DC | PRN

## 2013-04-24 ENCOUNTER — Encounter: Payer: Self-pay | Admitting: Family Medicine

## 2013-04-24 ENCOUNTER — Ambulatory Visit (INDEPENDENT_AMBULATORY_CARE_PROVIDER_SITE_OTHER): Payer: BC Managed Care – PPO | Admitting: Family Medicine

## 2013-04-24 VITALS — BP 112/74 | Ht 63.0 in | Wt 159.0 lb

## 2013-04-24 DIAGNOSIS — E119 Type 2 diabetes mellitus without complications: Secondary | ICD-10-CM

## 2013-04-24 DIAGNOSIS — I2510 Atherosclerotic heart disease of native coronary artery without angina pectoris: Secondary | ICD-10-CM

## 2013-04-24 DIAGNOSIS — E1142 Type 2 diabetes mellitus with diabetic polyneuropathy: Secondary | ICD-10-CM

## 2013-04-24 DIAGNOSIS — I10 Essential (primary) hypertension: Secondary | ICD-10-CM

## 2013-04-24 DIAGNOSIS — E1149 Type 2 diabetes mellitus with other diabetic neurological complication: Secondary | ICD-10-CM

## 2013-04-24 DIAGNOSIS — E1140 Type 2 diabetes mellitus with diabetic neuropathy, unspecified: Secondary | ICD-10-CM

## 2013-04-24 LAB — POCT GLYCOSYLATED HEMOGLOBIN (HGB A1C): Hemoglobin A1C: 5.5

## 2013-04-24 MED ORDER — LISINOPRIL 40 MG PO TABS: 40.0000 mg | ORAL_TABLET | Freq: Every day | ORAL | Status: DC

## 2013-04-24 MED ORDER — METFORMIN HCL 500 MG PO TABS: 500.0000 mg | ORAL_TABLET | Freq: Two times a day (BID) | ORAL | Status: DC

## 2013-04-24 NOTE — Progress Notes (Signed)
   Subjective:    Patient ID: Nichole May, female    DOB: 1950-11-13, 63 y.o.   MRN: [id number]  Diabetes She presents for her follow-up diabetic visit. She has type 2 diabetes mellitus. She is compliant with treatment all of the time. She is following a generally healthy diet. Her breakfast blood glucose range is generally 110-130 mg/dl. She does not see a podiatrist.Eye exam is current.   Patient states no other concerns today.  Results for orders placed in visit on 04/24/13  POCT GLYCOSYLATED HEMOGLOBIN (HGB A1C)      Result Value Ref Range   Hemoglobin A1C 5.5     Glu's  110 11  112 No low susgar spell  Compliant with Bp med, notes numbers are generally good qui  Uses pickle juice, Watching diet so so  Trying to watch fat intake  Eye doc visits went well per patient.  Compliant with all medications. No chest pain no abdominal pain no shortness of breath.  Review of Systems As noted above. No nausea no diaphoresis no blood no rash ROS otherwise negative.    Objective:   Physical Exam  Alert HEENT normal. Vital stable. Lungs clear. Heart regular in rhythm. Neuro exam intact. Ankles without edema.  See diabetic foot exam    Assessment & Plan:  Impression 1 hypertension good control. #2 type 2 diabetes good control discussed. #3 coronary artery disease clinically silent. Plan diet exercise discussed. Maintain same medications. Diet exercise discussed. Check in 6 months. WSL

## 2013-07-20 ENCOUNTER — Other Ambulatory Visit: Payer: Self-pay | Admitting: Family Medicine

## 2013-07-23 ENCOUNTER — Telehealth: Payer: Self-pay | Admitting: Adult Health

## 2013-07-23 NOTE — Telephone Encounter (Signed)
error 

## 2013-08-12 ENCOUNTER — Other Ambulatory Visit: Payer: Self-pay | Admitting: Family Medicine

## 2013-09-24 ENCOUNTER — Ambulatory Visit (INDEPENDENT_AMBULATORY_CARE_PROVIDER_SITE_OTHER): Payer: BC Managed Care – PPO | Admitting: Adult Health

## 2013-09-24 ENCOUNTER — Encounter (INDEPENDENT_AMBULATORY_CARE_PROVIDER_SITE_OTHER): Payer: Self-pay

## 2013-09-24 ENCOUNTER — Encounter: Payer: Self-pay | Admitting: Adult Health

## 2013-09-24 VITALS — BP 131/74 | HR 86 | Ht 66.0 in | Wt 141.0 lb

## 2013-09-24 DIAGNOSIS — Z720 Tobacco use: Secondary | ICD-10-CM

## 2013-09-24 DIAGNOSIS — I10 Essential (primary) hypertension: Secondary | ICD-10-CM

## 2013-09-24 DIAGNOSIS — F17200 Nicotine dependence, unspecified, uncomplicated: Secondary | ICD-10-CM

## 2013-09-24 NOTE — Progress Notes (Deleted)
Name: Nichole May    DOB: Apr 26, 1950  Age: 63 y.o.  MR#: [id number]       PCP:  Harlow Asa, MD      Insurance: Payor: BLUE CROSS BLUE [employer]IELD / Plan: BCBS [place] PPO / Product Type: *No Product type* /   CC:    Chief Complaint  Patient presents with  . Coronary Artery Disease    VS Filed Vitals:   09/24/13 1447  BP: 131/74  Pulse: 86  Height: 5\' 6"  (1.676 m)  Weight: 141 lb (63.957 kg)    Weights Current Weight  09/24/13 141 lb (63.957 kg)  04/24/13 159 lb (72.122 kg)  01/14/13 151 lb 12.8 oz (68.856 kg)    Blood Pressure  BP Readings from Last 3 Encounters:  09/24/13 131/74  04/24/13 112/74  01/14/13 138/88     Admit date:  (Not on file) Last encounter with RMR:  07/23/2013   Allergy Review of patient's allergies indicates no known allergies.  Current Outpatient Prescriptions  Medication Sig Dispense Refill  . acetaminophen (TYLENOL) 500 MG tablet Take 500 mg by mouth every 6 (six) hours as needed for pain.      Marland Kitchen aspirin 81 MG tablet Take 81 mg by mouth daily.      Marland Kitchen BAYER MICROLET LANCETS lancets USE AS DIRECTED THREE TIMES DAILY.  100 each  0  . FREESTYLE TEST STRIPS test strip USE AS DIRECTED.  50 each  0  . hydrochlorothiazide (MICROZIDE) 12.5 MG capsule Take 1 capsule (12.5 mg total) by mouth daily.  30 capsule  11  . lisinopril (PRINIVIL,ZESTRIL) 40 MG tablet Take 1 tablet (40 mg total) by mouth daily.  30 tablet  5  . metFORMIN (GLUCOPHAGE) 500 MG tablet Take 1 tablet (500 mg total) by mouth 2 (two) times daily with a meal.  60 tablet  5  . nitroGLYCERIN (NITROSTAT) 0.4 MG SL tablet Place 1 tablet (0.4 mg total) under the tongue every 5 (five) minutes x 3 doses as needed for chest pain.  25 tablet  3  . prasugrel (EFFIENT) 10 MG TABS Take 1 tablet (10 mg total) by mouth daily.  30 tablet  11   No current facility-administered medications for this visit.    Discontinued Meds:   There are no discontinued medications.  Patient Active Problem List   Diagnosis  Date Noted  . Bell's palsy 12/23/2012  . Diabetic neuropathy 06/24/2012  . Essential hypertension, benign 06/24/2012  . Systolic dysfunction 10/25/2011  . Diabetes mellitus 10/09/2011  . Tobacco abuse 10/09/2011  . CAD (coronary artery disease) 10/09/2011    LABS    Component Value Date/Time   NA 143 08/22/2012 1605   NA 141 02/18/2012 1444   NA 140 10/11/2011 0503   K 3.7 08/22/2012 1605   K 4.0 02/18/2012 1444   K 3.1* 10/11/2011 0503   CL 102 08/22/2012 1605   CL 104 02/18/2012 1444   CL 104 10/11/2011 0503   CO2 27 08/22/2012 1605   CO2 32 02/18/2012 1444   CO2 25 10/11/2011 0503   GLUCOSE 97 08/22/2012 1605   GLUCOSE 207* 02/18/2012 1444   GLUCOSE 128* 10/11/2011 0503   BUN 11 08/22/2012 1605   BUN 20 02/18/2012 1444   BUN 17 10/11/2011 0503   CREATININE 0.52 08/22/2012 1605   CREATININE 0.53 02/18/2012 1444   CREATININE 0.45* 10/11/2011 0503   CREATININE 0.42* 10/10/2011 0600   CREATININE 0.43* 10/09/2011 0255   CALCIUM 9.7 08/22/2012 1605  CALCIUM 9.2 02/18/2012 1444   CALCIUM 8.6 10/11/2011 0503   GFRNONAA >90 10/11/2011 0503   GFRNONAA >90 10/10/2011 0600   GFRNONAA >90 10/09/2011 0255   GFRAA >90 10/11/2011 0503   GFRAA >90 10/10/2011 0600   GFRAA >90 10/09/2011 0255   CMP     Component Value Date/Time   NA 143 08/22/2012 1605   K 3.7 08/22/2012 1605   CL 102 08/22/2012 1605   CO2 27 08/22/2012 1605   GLUCOSE 97 08/22/2012 1605   BUN 11 08/22/2012 1605   CREATININE 0.52 08/22/2012 1605   CREATININE 0.45* 10/11/2011 0503   CALCIUM 9.7 08/22/2012 1605   PROT 5.8* 10/10/2011 0600   ALBUMIN 3.0* 10/10/2011 0600   AST 45* 10/10/2011 0600   ALT 40* 10/10/2011 0600   ALKPHOS 58 10/10/2011 0600   BILITOT 0.8 10/10/2011 0600   GFRNONAA >90 10/11/2011 0503   GFRAA >90 10/11/2011 0503       Component Value Date/Time   WBC 7.2 08/22/2012 1605   WBC 6.5 02/18/2012 1444   WBC 7.6 10/11/2011 0503   HGB 12.5 08/22/2012 1605   HGB 12.1 02/18/2012 1444   HGB 10.6* 10/11/2011 0503   HCT 37.0 08/22/2012 1605   HCT  34.7* 02/18/2012 1444   HCT 31.1* 10/11/2011 0503   MCV 90.5 08/22/2012 1605   MCV 91.3 02/18/2012 1444   MCV 98.1 10/11/2011 0503    Lipid Panel     Component Value Date/Time   CHOL 135 10/09/2011 0255   TRIG 199* 10/09/2011 0255   HDL 33* 10/09/2011 0255   CHOLHDL 4.1 10/09/2011 0255   VLDL 40 10/09/2011 0255   LDLCALC 62 10/09/2011 0255    ABG No results found for this basename: phart, pco2, pco2art, po2, po2art, hco3, tco2, acidbasedef, o2sat     Lab Results  Component Value Date   TSH 1.316 10/08/2011   BNP (last 3 results) No results found for this basename: PROBNP,  in the last 8760 hours Cardiac Panel (last 3 results) No results found for this basename: CKTOTAL, CKMB, TROPONINI, RELINDX,  in the last 72 hours  Iron/TIBC/Ferritin/ %Sat No results found for this basename: iron, tibc, ferritin, ironpctsat     EKG Orders placed in visit on 09/24/13  . EKG 12-LEAD     Prior Assessment and Plan Problem List as of 09/24/2013     Cardiovascular and Mediastinum   CAD (coronary artery disease)   Last Assessment & Plan   09/25/2012 Office Visit Written 09/25/2012  4:40 PM by Jodelle Gross, NP     She is doing well and without cardiac complaint. She remains active. Will continue risk management. She will be seen in one year unless symptomatic. She will continue current medications without changes. Refills will be provided.    Essential hypertension, benign   Last Assessment & Plan   09/25/2012 Office Visit Written 09/25/2012  4:40 PM by Jodelle Gross, NP     Well controlled currently. She will continue Heart Healthy diet and risk management. Will see her in one year unless symptomatic.      Endocrine   Diabetes mellitus   Last Assessment & Plan   02/18/2012 Office Visit Written 02/18/2012  2:11 PM by Jodelle Gross, NP     She states that she ran out of her metformin and has no refills until being seen by Dr. Fletcher Anon office. She is having trouble making an appointment  with them. We have made an appointment for Tuesday, January  14th for her. In the interim, I have given her a Rx for metformin 500 mg daily as she was taking. No refills are provided. A Hgb A1C is ordered. She will follow up with Dr. Fletcher AnonLuking's office for management.      Nervous and Auditory   Diabetic neuropathy   Bell's palsy     Other   Tobacco abuse   Systolic dysfunction   Last Assessment & Plan   08/22/2012 Office Visit Written 08/22/2012  4:47 PM by Jodelle GrossKathryn M Lawrence, NP     Will reevaluate echocardiogram in 6 months. May consider adding low-dose beta blocker should her PACs become frequent and symptomatic for her. We will continue her on her current medications right now.        Imaging: No results found.

## 2013-09-24 NOTE — Assessment & Plan Note (Signed)
She states she no longer smokes cigarettes. Sister who is with her confirms.

## 2013-09-24 NOTE — Progress Notes (Signed)
HPI: Nichole May is a 63 year old patient to be est. with Dr. Wyline Mood or Dr. Beulah Gandy were followed for ongoing assessment and management of CAD with history of inferior ST elevation MI, PCI with drug-eluting stents the proximal RCA, along with moderate disease in the mid LAD with stent placement the take off of a small diagonal branch. 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 EF was 45%. She was last seen in the office in August of 2014. She is medically compliant and was without cardiac complaint. She is here for annual followup.  She comes today without any cardiac complaints. She has recently lost her husband to death in 2013-07-03 and continues to be emotional in discussing it. She denies recurrent chest pain, dyspnea on exertion, or fatigue. She admits to not following up with blood glucose testing as she was directed to by her primary care physician Dr.Luking. She is medically compliant.  No Known Allergies  Current Outpatient Prescriptions  Medication Sig Dispense Refill  . acetaminophen (TYLENOL) 500 MG tablet Take 500 mg by mouth every 6 (six) hours as needed for pain.      Marland Kitchen aspirin 81 MG tablet Take 81 mg by mouth daily.      Marland Kitchen BAYER MICROLET LANCETS lancets USE AS DIRECTED THREE TIMES DAILY.  100 each  0  . FREESTYLE TEST STRIPS test strip USE AS DIRECTED.  50 each  0  . hydrochlorothiazide (MICROZIDE) 12.5 MG capsule Take 1 capsule (12.5 mg total) by mouth daily.  30 capsule  11  . lisinopril (PRINIVIL,ZESTRIL) 40 MG tablet Take 1 tablet (40 mg total) by mouth daily.  30 tablet  5  . metFORMIN (GLUCOPHAGE) 500 MG tablet Take 1 tablet (500 mg total) by mouth 2 (two) times daily with a meal.  60 tablet  5  . nitroGLYCERIN (NITROSTAT) 0.4 MG SL tablet Place 1 tablet (0.4 mg total) under the tongue every 5 (five) minutes x 3 doses as needed for chest pain.  25 tablet  3  . prasugrel (EFFIENT) 10 MG TABS Take 1 tablet (10 mg total) by mouth daily.  30  tablet  11   No current facility-administered medications for this visit.    Past Medical History  Diagnosis Date  . Diabetes mellitus   . Hypertension   . Tobacco abuse   . CAD (coronary artery disease)     10/08/11 Inf STEMI  . Hyperlipidemia     Past Surgical History  Procedure Laterality Date  . Denies    . Coronary angioplasty with stent placement      ONG:EXBMWU of systems complete and found to be negative unless listed above  PHYSICAL EXAM BP 131/74  Pulse 86  Ht 5\' 6"  (1.676 m)  Wt 141 lb (63.957 kg)  BMI 22.77 kg/m2 General: Well developed, well nourished, in no acute distress Head: Eyes PERRLA, No xanthomas.   Normal cephalic and atramatic  Lungs: Clear bilaterally to auscultation and percussion. Heart: HRRR S1 S2, without MRG.  Pulses are 2+ & equal.            No carotid bruit. No JVD.  No abdominal bruits. No femoral bruits. Abdomen: Bowel sounds are positive, abdomen soft and non-tender without masses or                  Hernia's noted. Msk:  Back normal, normal gait. Normal strength and tone for age. Extremities: No clubbing, cyanosis or edema.  DP +  1 Neuro: Alert and oriented X 3. Psych:  Good affect, responds appropriately   EKG:  Sinus rhythm with sinus arrhythmia, ventricular rate of 66 beats per minute,  ASSESSMENT AND PLAN

## 2013-09-24 NOTE — Assessment & Plan Note (Signed)
Currently well-controlled. No changes in her medication regimen at this time. 

## 2013-09-24 NOTE — Patient Instructions (Addendum)
Your physician wants you to follow-up in: 1 year. You will receive a reminder letter in the mail two months in advance. If you don't receive a letter, please call our office to schedule the follow-up appointment.  Your physician recommends that you return for lab work before you appointment with Dr. Gerda DissLuking as he will need those results.  Your physician recommends that you continue on your current medications as directed. Please refer to the Current Medication list given to you today.   Thank you for choosing [facility]!!

## 2013-09-24 NOTE — Assessment & Plan Note (Signed)
She states she has not been checking her blood glucose as directed. She is to see her primary care physician in 3 weeks. She states that she will begin checking at regularly. Hemoglobin A1c will be drawn with current labs.

## 2013-09-24 NOTE — Assessment & Plan Note (Addendum)
She is currently asymptomatic. She is medically compliant. I will not plan any cardiac testing at this time as she is doing well. She will continue risk management. The patient, continue on aspirin and Effient. Refills on all cardiac medications are provided. I will check labs for evaluation of current status, with copy to primary care physician Dr.Luking. This will include fasting lipids LFTs, BMET, CBC.

## 2013-10-07 LAB — LIPID PANEL
CHOL/HDL RATIO: 6.4 ratio
Cholesterol: 192 mg/dL (ref 0–200)
HDL: 30 mg/dL — AB (ref >39)
LDL Cholesterol: 113 mg/dL — ABNORMAL HIGH (ref 0–99)
Triglycerides: 244 mg/dL — ABNORMAL HIGH (ref <150)
VLDL: 49 mg/dL — AB (ref 0–40)

## 2013-10-07 LAB — BASIC METABOLIC PANEL
BUN: 10 mg/dL (ref 6–23)
CALCIUM: 9.1 mg/dL (ref 8.4–10.5)
CO2: 29 mEq/L (ref 19–32)
Chloride: 105 mEq/L (ref 96–112)
Creat: 0.51 mg/dL (ref 0.50–1.10)
Glucose, Bld: 119 mg/dL — ABNORMAL HIGH (ref 70–99)
POTASSIUM: 4 meq/L (ref 3.5–5.3)
SODIUM: 143 meq/L (ref 135–145)

## 2013-10-07 LAB — HEPATIC FUNCTION PANEL
ALBUMIN: 4.3 g/dL (ref 3.5–5.2)
ALT: 12 U/L (ref 0–35)
AST: 11 U/L (ref 0–37)
Alkaline Phosphatase: 47 U/L (ref 39–117)
BILIRUBIN DIRECT: 0.2 mg/dL (ref 0.0–0.3)
Indirect Bilirubin: 0.8 mg/dL (ref 0.2–1.2)
Total Bilirubin: 1 mg/dL (ref 0.2–1.2)
Total Protein: 6.6 g/dL (ref 6.0–8.3)

## 2013-10-07 LAB — HEMOGLOBIN A1C
HEMOGLOBIN A1C: 5.5 % (ref <5.7)
Mean Plasma Glucose: 111 mg/dL (ref <117)

## 2013-10-08 ENCOUNTER — Telehealth: Payer: Self-pay | Admitting: *Deleted

## 2013-10-08 MED ORDER — LOVASTATIN ER 20 MG PO TB24: 20.0000 mg | ORAL_TABLET | Freq: Every day | ORAL | Status: DC

## 2013-10-08 NOTE — Telephone Encounter (Signed)
Patients sister Joyice Faster called back with questions about what foods to eat to help her sister lower her triglycerides and to ask about lovastatin again. Explained Lovastatin and pt has appointment to see [name] on September 14 th. Mailed sister a list of foods for lowering triglycerides

## 2013-10-08 NOTE — Telephone Encounter (Signed)
Message copied by Vernon Prey on Thu Oct 08, 2013  1:29 PM ------      Message from: Jodelle Gross      Created: Thu Oct 08, 2013  1:22 PM       Significantly elevated triglycerides due to uncontrolled diabetes. She will need to see PCP for institution of fenofibrate. In the interim, please start her on lovastatin 20 mg ([employer]) daily. Send copy to PCP ------

## 2013-10-08 NOTE — Telephone Encounter (Signed)
Notified pt of results and to start lovastatin 20 mg daily, said she would make appt with [name] and she understood. Forwarded lab results to [name]

## 2013-10-09 ENCOUNTER — Telehealth: Payer: Self-pay | Admitting: Adult Health

## 2013-10-09 MED ORDER — PRASUGREL HCL 10 MG PO TABS: 10.0000 mg | ORAL_TABLET | Freq: Every day | ORAL | Status: DC

## 2013-10-09 NOTE — Telephone Encounter (Signed)
Received fax refill request  Rx # [id number] Medication:  Effient 10 mg tablets Qty 30 Sig:  Take one tablet by mouth once daily Physician:  Lyman Bishop

## 2013-10-09 NOTE — Telephone Encounter (Signed)
Refill complete 

## 2013-10-26 ENCOUNTER — Ambulatory Visit (INDEPENDENT_AMBULATORY_CARE_PROVIDER_SITE_OTHER): Payer: BC Managed Care – PPO | Admitting: Family Medicine

## 2013-10-26 ENCOUNTER — Encounter: Payer: Self-pay | Admitting: Family Medicine

## 2013-10-26 VITALS — BP 140/84 | Ht 63.0 in | Wt 147.2 lb

## 2013-10-26 DIAGNOSIS — E119 Type 2 diabetes mellitus without complications: Secondary | ICD-10-CM

## 2013-10-26 DIAGNOSIS — Z23 Encounter for immunization: Secondary | ICD-10-CM

## 2013-10-26 MED ORDER — HYDROCHLOROTHIAZIDE 12.5 MG PO CAPS: 12.5000 mg | ORAL_CAPSULE | Freq: Every day | ORAL | Status: DC

## 2013-10-26 MED ORDER — METFORMIN HCL 500 MG PO TABS: 500.0000 mg | ORAL_TABLET | Freq: Two times a day (BID) | ORAL | Status: DC

## 2013-10-26 MED ORDER — LOVASTATIN ER 20 MG PO TB24: 20.0000 mg | ORAL_TABLET | Freq: Every day | ORAL | Status: DC

## 2013-10-26 MED ORDER — LISINOPRIL 40 MG PO TABS: 40.0000 mg | ORAL_TABLET | Freq: Every day | ORAL | Status: DC

## 2013-10-26 NOTE — Progress Notes (Signed)
   Subjective:    Patient ID: Nichole May, female    DOB: 1950-07-10, 63 y.o.   MRN: [id number]  Diabetes She presents for her follow-up diabetic visit. She has type 2 diabetes mellitus. Her disease course has been stable. There are no hypoglycemic associated symptoms. There are no diabetic associated symptoms. There are no hypoglycemic complications. Symptoms are stable. There are no diabetic complications. There are no known risk factors for coronary artery disease. Current diabetic treatment includes oral agent (monotherapy). She is compliant with treatment all of the time.   Patient had A1C done through cardiology on 10/07/13 and it was 5.5.   Results for orders placed in visit on 09/24/13  LIPID PANEL      Result Value Ref Range   Cholesterol 192  0 - 200 mg/dL   Triglycerides 409 (*) <150 mg/dL   HDL 30 (*) >81 mg/dL   Total CHOL/HDL Ratio 6.4     VLDL 49 (*) 0 - 40 mg/dL   LDL Cholesterol 191 (*) 0 - 99 mg/dL  HEPATIC FUNCTION PANEL      Result Value Ref Range   Total Bilirubin 1.0  0.2 - 1.2 mg/dL   Bilirubin, Direct 0.2  0.0 - 0.3 mg/dL   Indirect Bilirubin 0.8  0.2 - 1.2 mg/dL   Alkaline Phosphatase 47  39 - 117 U/L   AST 11  0 - 37 U/L   ALT 12  0 - 35 U/L   Total Protein 6.6  6.0 - 8.3 g/dL   Albumin 4.3  3.5 - 5.2 g/dL  BASIC METABOLIC PANEL      Result Value Ref Range   Sodium 143  135 - 145 mEq/L   Potassium 4.0  3.5 - 5.3 mEq/L   Chloride 105  96 - 112 mEq/L   CO2 29  19 - 32 mEq/L   Glucose, Bld 119 (*) 70 - 99 mg/dL   BUN 10  6 - 23 mg/dL   Creat 4.78  2.95 - 6.21 mg/dL   Calcium 9.1  8.4 - 30.8 mg/dL  HEMOGLOBIN M5H      Result Value Ref Range   Hemoglobin A1C 5.5  <5.7 %   Mean Plasma Glucose 111  <117 mg/dL    Patient has known coronary artery disease. Recently saw a cardiologist office.  Also started on lipid medicine. 2 elevated LDL. So far handling well.  Compliant with blood pressure medication. Watching salt intake.  Checks sugars on occasion  but often non-fasting numbers 100-150.  Patient states that she has no other concerns at this time.   Saw the eye doc the past yr. Review of Systems No headache no chest pain no back pain no abdominal pain no change in bowel habits no blood in stool exervis regylarly     Objective:   Physical Exam  Alert no apparent distress. HEENT normal. Lungs clear. Heart regular in rhythm. Blood pressure excellent on repeat.      Assessment & Plan:  Impression #1 Type 2 diab, good control discussed #2 hypertension good control discussed. #3 hyperlipidemia suboptimal in control discussed length #4 coronary artery disease. #5 recent loss of spouse discussed patient still experiencing grief plan exercise encourage. Maintain the meds. Check in 6 months. Flu shot today. WSL

## 2013-10-27 ENCOUNTER — Ambulatory Visit: Payer: BC Managed Care – PPO | Admitting: Family Medicine

## 2013-10-28 ENCOUNTER — Ambulatory Visit: Payer: BC Managed Care – PPO | Admitting: Family Medicine

## 2014-01-21 ENCOUNTER — Encounter (HOSPITAL_COMMUNITY): Payer: Self-pay | Admitting: Cardiovascular Disease

## 2014-02-23 ENCOUNTER — Telehealth: Payer: Self-pay | Admitting: *Deleted

## 2014-02-23 NOTE — Telephone Encounter (Signed)
Please call kay jones not patient

## 2014-02-23 NOTE — Telephone Encounter (Signed)
Pt wants to know if she would be able to take all her medications in the morning. Would make it easier for her.

## 2014-02-25 NOTE — Telephone Encounter (Signed)
Will forward to K lawrence NP 

## 2014-02-25 NOTE — Telephone Encounter (Signed)
Spoke with Joyice FasterKay Jones, pt will take all meds in am except she will take her metformin bid

## 2014-02-25 NOTE — Telephone Encounter (Signed)
Ok to take her medication in the morning, all of them. No issues seen on review of her medication list that would cause concern.

## 2014-03-04 ENCOUNTER — Other Ambulatory Visit: Payer: Self-pay | Admitting: Family Medicine

## 2014-04-27 ENCOUNTER — Encounter: Payer: Self-pay | Admitting: Family Medicine

## 2014-04-27 ENCOUNTER — Ambulatory Visit (INDEPENDENT_AMBULATORY_CARE_PROVIDER_SITE_OTHER): Payer: BLUE CROSS/BLUE SHIELD | Admitting: Family Medicine

## 2014-04-27 VITALS — BP 116/70 | Ht 63.0 in | Wt 148.0 lb

## 2014-04-27 DIAGNOSIS — I10 Essential (primary) hypertension: Secondary | ICD-10-CM | POA: Diagnosis not present

## 2014-04-27 DIAGNOSIS — E119 Type 2 diabetes mellitus without complications: Secondary | ICD-10-CM

## 2014-04-27 DIAGNOSIS — E785 Hyperlipidemia, unspecified: Secondary | ICD-10-CM | POA: Diagnosis not present

## 2014-04-27 DIAGNOSIS — Z79899 Other long term (current) drug therapy: Secondary | ICD-10-CM

## 2014-04-27 LAB — POCT GLYCOSYLATED HEMOGLOBIN (HGB A1C): HEMOGLOBIN A1C: 5.4

## 2014-04-27 MED ORDER — METFORMIN HCL 500 MG PO TABS: 500.0000 mg | ORAL_TABLET | Freq: Every day | ORAL | Status: DC

## 2014-04-27 MED ORDER — CITALOPRAM HYDROBROMIDE 20 MG PO TABS: 20.0000 mg | ORAL_TABLET | Freq: Every day | ORAL | Status: DC

## 2014-04-27 NOTE — Progress Notes (Signed)
   Subjective:    Patient ID: Nichole May, female    DOB: 1950/08/24, 64 y.o.   MRN: [id number] Patient arrives with numerous concerns. Diabetes She presents for her follow-up diabetic visit. She has type 2 diabetes mellitus. Her disease course has been stable. There are no hypoglycemic associated symptoms. There are no diabetic associated symptoms. There are no hypoglycemic complications. Symptoms are stable. There are no diabetic complications. There are no known risk factors for coronary artery disease. Current diabetic treatment includes oral agent (monotherapy). She is compliant with treatment all of the time.   Patient states that she is having issues with memory loss. Has been under a lot of stress lately. Loss husband last year from sudden illness. No suicidal or homicidal thoughts.   She would like to discuss this with the doctor. Patient also has a lot of night sweats.   Sugar doing good.  Results for orders placed or performed in visit on 04/27/14  POCT glycosylated hemoglobin (Hb A1C)  Result Value Ref Range   Hemoglobin A1C 5.4    Exercise quite a bit per wk  Tries to walk in the house  Compliant with blood pressure medicine. Watching salt intake. Next  Compliant with lipid medicine. Has cut down fat diet. No obvious side effects.  Review of Systems No headache no chest pain no back pain abdominal pain no change in bowel habits    Objective:   Physical Exam  Alert vitals stable. HEENT normal. Lungs clear. Heart regular in rhythm. Ankles without edema      Assessment & Plan:  Impression 1 type 2 diabetes control 2 type #2 hypertension good control #3 hyperlipidemia status uncertain #4 depression discussed plan appropriate blood work. Diet exercise discussed in encourage had Celexa. 20 mg daily. Cut down metformin to 1 daily. Follow-up as scheduled. WSL

## 2014-04-27 NOTE — Patient Instructions (Signed)
Take Metformin 500 mg 1 tablet every day. Take Celexa 20 mg 1 tablet every day.

## 2014-04-28 ENCOUNTER — Telehealth: Payer: Self-pay | Admitting: Family Medicine

## 2014-04-28 LAB — LIPID PANEL
CHOLESTEROL TOTAL: 163 mg/dL (ref 100–199)
Chol/HDL Ratio: 3.9 ratio units (ref 0.0–4.4)
HDL: 42 mg/dL (ref >39)
LDL CALC: 62 mg/dL (ref 0–99)
TRIGLYCERIDES: 297 mg/dL — AB (ref 0–149)
VLDL Cholesterol Cal: 59 mg/dL — ABNORMAL HIGH (ref 5–40)

## 2014-04-28 LAB — HEPATIC FUNCTION PANEL
ALBUMIN: 4.8 g/dL (ref 3.6–4.8)
ALT: 9 IU/L (ref 0–32)
AST: 13 IU/L (ref 0–40)
Alkaline Phosphatase: 54 IU/L (ref 39–117)
BILIRUBIN TOTAL: 0.7 mg/dL (ref 0.0–1.2)
Bilirubin, Direct: 0.17 mg/dL (ref 0.00–0.40)
Total Protein: 7.1 g/dL (ref 6.0–8.5)

## 2014-04-28 NOTE — Telephone Encounter (Signed)
Waiting on doc to review lab results.

## 2014-04-28 NOTE — Telephone Encounter (Signed)
Please call the patients sister for the lab results   Home [phone] Cell  [phone]

## 2014-05-03 ENCOUNTER — Other Ambulatory Visit: Payer: Self-pay | Admitting: Family Medicine

## 2014-05-10 ENCOUNTER — Telehealth: Payer: Self-pay | Admitting: Family Medicine

## 2014-05-10 NOTE — Telephone Encounter (Signed)
Pt's sister dropped of roi form and is wanting to know if Dr. Brett CanalesSteve has found out why the  Pt was on two blood thinners.

## 2014-05-10 NOTE — Telephone Encounter (Signed)
Sister is also wanting to know the pt's blood work results.

## 2014-05-11 NOTE — Telephone Encounter (Signed)
I responded to the blood work request, May be listed under pt or kay's chart, last wk or two letting office know that it could be shared with kay as long as she has HIPAA approval, look back and proceed accordingly, plz. Also, what does s ROI stand for? Also, please call (nurse) pts cardiologist's office and ask them do they want her on both the aspirin and her other anticoag med? Family was confused and not sure. Find this out and let me know plz.

## 2014-05-11 NOTE — Telephone Encounter (Signed)
ROI stands for Release of Information  Youth workerCalled Cardiologist and they said [facility] has a stent placement. The dual therapy (asa and Effient) if for anti-platelets.

## 2014-05-11 NOTE — Telephone Encounter (Signed)
Patient's sister notified and verbalized understanding of the test results.

## 2014-05-14 NOTE — Progress Notes (Signed)
Patient's sister notified and verbalized understanding of the test results. She is now on HIPPA.

## 2014-05-24 ENCOUNTER — Telehealth: Payer: Self-pay | Admitting: Adult Health

## 2014-05-24 NOTE — Telephone Encounter (Signed)
Wants to know if patient can be switched from Effient to Plavix due to cost / tg

## 2014-05-24 NOTE — Telephone Encounter (Signed)
Will forward to K Lawrence NP 

## 2014-05-25 ENCOUNTER — Telehealth: Payer: Self-pay

## 2014-05-25 MED ORDER — CLOPIDOGREL BISULFATE 75 MG PO TABS: 75.0000 mg | ORAL_TABLET | Freq: Every day | ORAL | Status: DC

## 2014-05-25 NOTE — Telephone Encounter (Signed)
escribed plavix rx to [facility],pt's vm is full unable to leave message

## 2014-05-25 NOTE — Telephone Encounter (Signed)
Yes. She can change to Plavix 75 mg daily.

## 2014-05-25 NOTE — Telephone Encounter (Signed)
VM full

## 2014-05-26 NOTE — Telephone Encounter (Signed)
Unable to reach pt,vm remains full mailed letter

## 2014-06-04 ENCOUNTER — Other Ambulatory Visit: Payer: Self-pay | Admitting: Family Medicine

## 2014-06-30 ENCOUNTER — Encounter: Payer: Self-pay | Admitting: Family Medicine

## 2014-06-30 ENCOUNTER — Ambulatory Visit (INDEPENDENT_AMBULATORY_CARE_PROVIDER_SITE_OTHER): Payer: BLUE CROSS/BLUE SHIELD | Admitting: Family Medicine

## 2014-06-30 ENCOUNTER — Encounter (HOSPITAL_COMMUNITY): Payer: Self-pay | Admitting: Emergency Medicine

## 2014-06-30 ENCOUNTER — Emergency Department (HOSPITAL_COMMUNITY)
Admission: EM | Admit: 2014-06-30 | Discharge: 2014-07-01 | Disposition: A | Discharge status: Home / Self Care | Payer: BLUE CROSS/BLUE SHIELD | Attending: Emergency Medicine | Admitting: Emergency Medicine

## 2014-06-30 ENCOUNTER — Emergency Department (HOSPITAL_COMMUNITY): Payer: BLUE CROSS/BLUE SHIELD

## 2014-06-30 VITALS — BP 120/90 | Temp 98.2°F | Ht 63.0 in | Wt 143.0 lb

## 2014-06-30 DIAGNOSIS — E119 Type 2 diabetes mellitus without complications: Secondary | ICD-10-CM | POA: Diagnosis not present

## 2014-06-30 DIAGNOSIS — J029 Acute pharyngitis, unspecified: Secondary | ICD-10-CM

## 2014-06-30 DIAGNOSIS — Z9889 Other specified postprocedural states: Secondary | ICD-10-CM | POA: Insufficient documentation

## 2014-06-30 DIAGNOSIS — Z87891 Personal history of nicotine dependence: Secondary | ICD-10-CM | POA: Diagnosis not present

## 2014-06-30 DIAGNOSIS — J36 Peritonsillar abscess: Secondary | ICD-10-CM | POA: Insufficient documentation

## 2014-06-30 DIAGNOSIS — R6883 Chills (without fever): Secondary | ICD-10-CM | POA: Diagnosis not present

## 2014-06-30 DIAGNOSIS — Z79899 Other long term (current) drug therapy: Secondary | ICD-10-CM | POA: Insufficient documentation

## 2014-06-30 DIAGNOSIS — I2510 Atherosclerotic heart disease of native coronary artery without angina pectoris: Secondary | ICD-10-CM | POA: Diagnosis not present

## 2014-06-30 DIAGNOSIS — I10 Essential (primary) hypertension: Secondary | ICD-10-CM | POA: Insufficient documentation

## 2014-06-30 DIAGNOSIS — Z7982 Long term (current) use of aspirin: Secondary | ICD-10-CM | POA: Insufficient documentation

## 2014-06-30 DIAGNOSIS — Z7902 Long term (current) use of antithrombotics/antiplatelets: Secondary | ICD-10-CM | POA: Insufficient documentation

## 2014-06-30 DIAGNOSIS — E785 Hyperlipidemia, unspecified: Secondary | ICD-10-CM | POA: Insufficient documentation

## 2014-06-30 DIAGNOSIS — Z9861 Coronary angioplasty status: Secondary | ICD-10-CM | POA: Diagnosis not present

## 2014-06-30 LAB — CBC WITH DIFFERENTIAL/PLATELET
Basophils Absolute: 0 10*3/uL (ref 0.0–0.1)
Basophils Relative: 1 % (ref 0–1)
EOS ABS: 0.1 10*3/uL (ref 0.0–0.7)
Eosinophils Relative: 2 % (ref 0–5)
HEMATOCRIT: 35.3 % — AB (ref 36.0–46.0)
Hemoglobin: 12.2 g/dL (ref 12.0–15.0)
Lymphocytes Relative: 22 % (ref 12–46)
Lymphs Abs: 1.6 10*3/uL (ref 0.7–4.0)
MCH: 33.3 pg (ref 26.0–34.0)
MCHC: 34.6 g/dL (ref 30.0–36.0)
MCV: 96.4 fL (ref 78.0–100.0)
Monocytes Absolute: 0.4 10*3/uL (ref 0.1–1.0)
Monocytes Relative: 7 % (ref 3–12)
NEUTROS PCT: 68 % (ref 43–77)
Neutro Abs: 4.9 10*3/uL (ref 1.7–7.7)
Platelets: 187 10*3/uL (ref 150–400)
RBC: 3.66 MIL/uL — AB (ref 3.87–5.11)
RDW: 12.2 % (ref 11.5–15.5)
WBC: 7.1 10*3/uL (ref 4.0–10.5)

## 2014-06-30 LAB — COMPREHENSIVE METABOLIC PANEL
ALT: 9 U/L — ABNORMAL LOW (ref 14–54)
AST: 12 U/L — ABNORMAL LOW (ref 15–41)
Albumin: 4.2 g/dL (ref 3.5–5.0)
Alkaline Phosphatase: 49 U/L (ref 38–126)
Anion gap: 12 (ref 5–15)
BUN: 16 mg/dL (ref 6–20)
CALCIUM: 9.2 mg/dL (ref 8.9–10.3)
CO2: 28 mmol/L (ref 22–32)
CREATININE: 0.59 mg/dL (ref 0.44–1.00)
Chloride: 102 mmol/L (ref 101–111)
GFR calc non Af Amer: >60 mL/min (ref >60)
Glucose, Bld: 113 mg/dL — ABNORMAL HIGH (ref 65–99)
Potassium: 3.1 mmol/L — ABNORMAL LOW (ref 3.5–5.1)
SODIUM: 142 mmol/L (ref 135–145)
TOTAL PROTEIN: 7.6 g/dL (ref 6.5–8.1)
Total Bilirubin: 1.7 mg/dL — ABNORMAL HIGH (ref 0.3–1.2)

## 2014-06-30 LAB — POCT RAPID STREP A (OFFICE): Rapid Strep A Screen: NEGATIVE

## 2014-06-30 LAB — RAPID STREP SCREEN (MED CTR MEBANE ONLY): Streptococcus, Group A Screen (Direct): NEGATIVE

## 2014-06-30 MED ORDER — CLINDAMYCIN PHOSPHATE 600 MG/50ML IV SOLN
600.0000 mg | Freq: Once | INTRAVENOUS | Status: AC
Start: 2014-06-30 — End: 2014-07-01
  Administered 2014-07-01: 600 mg via INTRAVENOUS
  Filled 2014-06-30: qty 50

## 2014-06-30 MED ORDER — DEXAMETHASONE SODIUM PHOSPHATE 10 MG/ML IJ SOLN
20.0000 mg | Freq: Once | INTRAMUSCULAR | Status: AC
Start: 2014-06-30 — End: 2014-07-01
  Administered 2014-07-01: 20 mg via INTRAVENOUS
  Filled 2014-06-30: qty 2

## 2014-06-30 MED ORDER — CLINDAMYCIN PHOSPHATE 300 MG/50ML IV SOLN: 300.0000 mg | Freq: Once | INTRAVENOUS | Status: DC

## 2014-06-30 MED ORDER — IOHEXOL 300 MG/ML  SOLN
75.0000 mL | Freq: Once | INTRAMUSCULAR | Status: AC | PRN
  Administered 2014-06-30: 75 mL via INTRAVENOUS

## 2014-06-30 NOTE — Progress Notes (Signed)
   Subjective:    Patient ID: Nichole May, female    DOB: 26-Jul-1950, 64 y.o.   MRN: [id number]  Sore Throat  This is a new problem. The current episode started in the past 7 days. The problem has been gradually worsening. Neither side of throat is experiencing more pain than the other. The pain is moderate. Associated symptoms include ear pain. Pertinent negatives include no abdominal pain, congestion, coughing, headaches or shortness of breath. She has tried nothing for the symptoms. The treatment provided no relief.   This patient is not a great historian she states it started over the past several days relates soreness in throat pain discomfort when she swallows denies high fever or chills. She states today she is not 8 or drink anything all day long because his been very painful to swallow and painful and discomforting   Review of Systems  Constitutional: Positive for chills. Negative for activity change, appetite change and fatigue.  HENT: Positive for ear pain and sore throat. Negative for congestion.   Respiratory: Negative for cough and shortness of breath.   Cardiovascular: Negative for chest pain.  Gastrointestinal: Negative for abdominal pain.  Neurological: Negative for headaches.  Psychiatric/Behavioral: Negative for behavioral problems.       Objective:   Physical Exam  Constitutional: She appears well-developed.  HENT:  Head: Normocephalic and atraumatic.  Right Ear: External ear normal.  Left Ear: External ear normal.  Neck: Normal range of motion. Neck supple.  Cardiovascular: Normal rate, regular rhythm and normal heart sounds.   No murmur heard. Pulmonary/Chest: Effort normal and breath sounds normal.  Abdominal: Soft. She exhibits no distension. There is no tenderness.  Neurological: She is alert.  Skin: Skin is warm and dry.    She has a very severe throat infection tonsillar areas are very swollen her airway is narrow. Probably 25% of what it should  be. 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 I [facility] evaluation and possible IV antibiotics/ENT consultation The son was present I discussed with him the urgency of this and encouraged them to go today. The son related that he would be taking her to the ER today. The [facility] artery origin was called to alert them that she was coming

## 2014-06-30 NOTE — ED Notes (Signed)
Pt sent here for further eval of throat pain and trouble swallowing; no distress noted at present

## 2014-06-30 NOTE — ED Provider Notes (Signed)
CSN: [id number]     Arrival date & time 06/30/14  1819 History   First MD Initiated Contact with Patient 06/30/14 2014     Chief Complaint  Patient presents with  . Sore Throat     (Consider location/radiation/quality/duration/timing/severity/associated sxs/prior Treatment) HPI Comments: The patient is a 64 year old female, she has a history of diabetes, hypertension and hyperlipidemia, she has also had a history of coronary disease. She presents to the hospital with a complaint of difficulty swallowing.  According to the family members this has been going on for approximately 3 days, she has had some difficulty with her voice stating that her voice sounds different than usual, since coming to the hospital the symptoms have completely resolved. She has had no difficulty ambulating, no vomiting, no fevers chills nausea vomiting or cough, she has had no weakness of her upper extremities, the patient has seen her family doctor today who recommended that she come to the hospital for further evaluation.    Patient is a 64 y.o. female presenting with pharyngitis. The history is provided by the patient.  Sore Throat    Past Medical History  Diagnosis Date  . Diabetes mellitus   . Hypertension   . Tobacco abuse   . CAD (coronary artery disease)     10/08/11 Inf STEMI  . Hyperlipidemia    Past Surgical History  Procedure Laterality Date  . Denies    . Coronary angioplasty with stent placement    . Left heart catheterization with coronary angiogram N/A 10/08/2011    Procedure: LEFT HEART CATHETERIZATION WITH CORONARY ANGIOGRAM;  Surgeon: Kathleene Hazelhristopher D McAlhany, MD;  [facility];  Service: Cardiovascular;  Laterality: N/A;   Family History  Problem Relation Age of Onset  . Heart attack Father    History  Substance Use Topics  . Smoking status: Former Smoker -- 0.50 packs/day for 15 years    Types: Cigarettes  . Smokeless tobacco: Not on file  . Alcohol Use: No   OB History     No data available     Review of Systems  All other systems reviewed and are negative.     Allergies  Review of patient's allergies indicates no known allergies.  Home Medications   Prior to Admission medications   Medication Sig Start Date End Date Taking? Authorizing Provider  acetaminophen (TYLENOL) 500 MG tablet Take 500 mg by mouth every 6 (six) hours as needed for pain.   Yes Historical Provider, MD  aspirin 81 MG tablet Take 81 mg by mouth daily.   Yes Historical Provider, MD  clopidogrel (PLAVIX) 75 MG tablet Take 1 tablet (75 mg total) by mouth daily. 05/25/14  Yes Jodelle GrossKathryn M Lawrence, NP  hydrochlorothiazide (MICROZIDE) 12.5 MG capsule Take 1 capsule (12.5 mg total) by mouth daily. 10/26/13  Yes Merlyn AlbertWilliam S Luking, MD  lisinopril (PRINIVIL,ZESTRIL) 40 MG tablet TAKE ONE TABLET BY MOUTH ONCE DAILY. 05/04/14  Yes Merlyn AlbertWilliam S Luking, MD  lovastatin (MEVACOR) 20 MG tablet TAKE ONE TABLET BY MOUTH AT BEDTIME. 06/04/14  Yes Merlyn AlbertWilliam S Luking, MD  metFORMIN (GLUCOPHAGE) 500 MG tablet Take 1 tablet (500 mg total) by mouth daily with breakfast. 04/27/14  Yes Merlyn AlbertWilliam S Luking, MD  nitroGLYCERIN (NITROSTAT) 0.4 MG SL tablet Place 0.4 mg under the tongue every 5 (five) minutes as needed for chest pain.   Yes Historical Provider, MD  BAYER MICROLET LANCETS lancets USE AS DIRECTED THREE TIMES DAILY. 08/12/13   Merlyn AlbertWilliam S Luking, MD  citalopram (CELEXA)  20 MG tablet Take 1 tablet (20 mg total) by mouth daily. Patient not taking: Reported on 06/30/2014 04/27/14   Merlyn Albert, MD  clindamycin (CLEOCIN) 150 MG capsule Take 2 capsules (300 mg total) by mouth 4 (four) times daily. May dispense as  capsules 07/01/14   Eber Hong, MD  FREESTYLE TEST STRIPS test strip USE AS DIRECTED. 08/12/13   Merlyn Albert, MD  nitroGLYCERIN (NITROSTAT) 0.4 MG SL tablet Place 1 tablet (0.4 mg total) under the tongue every 5 (five) minutes x 3 doses as needed for chest pain. 04/13/13 04/13/14  Antoine Poche, MD    BP 134/64 mmHg  Pulse 61  Temp(Src) 98.2 F (36.8 C) (Oral)  Resp 16  SpO2 95% Physical Exam  Constitutional: She appears well-developed and well-nourished. No distress.  HENT:  Head: Normocephalic and atraumatic.  Mouth/Throat: Oropharynx is clear and moist. No oropharyngeal exudate.  Oropharynx is erythematous, there is bilateral tonsillar swelling which is symmetrical, uvula is midline, no exudate on the tonsils, phonation is normal though family member in the room states that her voice is not normal as it has a muted quality.  Eyes: Conjunctivae and EOM are normal. Pupils are equal, round, and reactive to light. Right eye exhibits no discharge. Left eye exhibits no discharge. No scleral icterus.  Neck: Normal range of motion. Neck supple. No JVD present. No thyromegaly present.  Cardiovascular: Normal rate, regular rhythm, normal heart sounds and intact distal pulses.  Exam reveals no gallop and no friction rub.   No murmur heard. Pulmonary/Chest: Effort normal and breath sounds normal. No respiratory distress. She has no wheezes. She has no rales.  Abdominal: Soft. Bowel sounds are normal. She exhibits no distension and no mass. There is no tenderness.  Musculoskeletal: Normal range of motion. She exhibits no edema or tenderness.  Lymphadenopathy:    She has no cervical adenopathy.  Neurological: She is alert. Coordination normal.  Skin: Skin is warm and dry. No rash noted. No erythema.  Psychiatric: She has a normal mood and affect. Her behavior is normal.  Nursing note and vitals reviewed.   ED Course  Procedures (including critical care time) Labs Review Labs Reviewed  COMPREHENSIVE METABOLIC PANEL - Abnormal; Notable for the following:    Potassium 3.1 (*)    Glucose, Bld 113 (*)    AST 12 (*)    ALT 9 (*)    Total Bilirubin 1.7 (*)    All other components within normal limits  CBC WITH DIFFERENTIAL/PLATELET - Abnormal; Notable for the following:    RBC 3.66 (*)     HCT 35.3 (*)    All other components within normal limits  RAPID STREP SCREEN  CULTURE, GROUP A STREP    Imaging Review Ct Soft Tissue Neck W Contrast  06/30/2014   CLINICAL DATA:  Throat pain, difficulty swallowing. History of diabetes, hypertension, tobacco abuse.  EXAM: CT NECK WITH CONTRAST  TECHNIQUE: Multidetector CT imaging of the neck was performed using the standard protocol following the bolus administration of intravenous contrast.  CONTRAST:  75mL OMNIPAQUE IOHEXOL 300 MG/ML  SOLN  COMPARISON:  None.  FINDINGS: Pharynx and larynx: LEFT greater than RIGHT pontine tonsil enlargement, with crescentic 14 x 3 mm focal fluid. Mild airway effacement. However no parapharyngeal fat tissue inflammation. 7 mm nodular density along the anterior aspect of the LEFT epiglottis extending to the glossal epiglottic fold. Effaced LEFT piriform sinus. Normal appearance of the larynx.  Salivary glands: Normal.  Thyroid: Thyromegaly,  2 LEFT thyroid nodules measuring up to 14 mm.  Lymph nodes: No lymphadenopathy by CT size criteria. 9 mm LEFT level 3 short axis lymph node.  Vascular: Moderate calcific atherosclerosis of the carotid bulbs without large vessel occlusion.  Limited intracranial: Moderate parenchymal brain volume loss, somewhat advanced for age. Confluent supratentorial white matter hypodensities, advanced for age suggest chronic small vessel ischemic disease.  Visualized orbits: Normal.  Mastoids and visualized paranasal sinuses: Small RIGHT maxillary mucosal retention cyst without paranasal sinus air-fluid levels. The mastoid air cells are well aerated. Soft tissue within the external auditory canals most consistent with cerumen.  Skeleton: C5-6 and C6-7 moderate to severe degenerative discs resulting in severe RIGHT C5-6 neural foraminal narrowing. No destructive bony lesions. Patient is edentulous.  Upper chest: 6 mm LEFT apical ground-glass nodule. LEFT upper lobe calcified granuloma. Multiple RIGHT  upper lobe ground-glass nodules measuring up to 4 mm.  IMPRESSION: Palatine tonsillar enlargement likely represents tonsillitis, however no are no peritonsillar inflammatory changes. Crescentic LEFT tonsillar 14 x 3 mm fluid collection, concerning for early abscess. Mild airway effacement.  Borderline LEFT level 3 lymph node enlargement may be reactive.  Thyromegaly.  LEFT upper lobe calcified granuloma in addition to scattered ground-glass nodules measuring up to 6 mm. Initial follow-up by chest CT without contrast is recommended in 3 months to confirm persistence. This recommendation follows the consensus statement: Recommendations for the Management of Subsolid Pulmonary Nodules Detected at CT: A Statement from the Fleischner Society as published in Radiology 2013; 266:304-317.   Electronically Signed   By: Awilda Metroourtnay  Bloomer   On: 06/30/2014 22:44      MDM   Final diagnoses:  Peritonsillar abscess    Other than having an erythematous throat the patient has no other acute findings on exam to raise concern for other etiologies including esophageal stricture, stroke or other problems. The outpatient internist as the patient come to the hospital for further evaluation and at this time a CT scan would be beneficial as the patient has some change in phonation, no fever, no tachycardia, the patient appears overall very stable.  D/w Dr. Suszanne Connerseoh who will see in office - agrees with CT findings and f/u on Friday - d/w pt and family - they are in agreement, clinda and decadron given prior to d/c.  Meds given in ED:  Medications  iohexol (OMNIPAQUE) 300 MG/ML solution 75 mL (75 mLs Intravenous Contrast Given 06/30/14 2215)  clindamycin (CLEOCIN) IVPB 600 mg (600 mg Intravenous New Bag/Given 07/01/14 0007)  dexamethasone (DECADRON) injection 20 mg (20 mg Intravenous Given 07/01/14 0007)    New Prescriptions   CLINDAMYCIN (CLEOCIN) 150 MG CAPSULE    Take 2 capsules (300 mg total) by mouth 4 (four) times  daily. May dispense as 150mg  capsules      Eber HongBrian Rajat Staver, MD 07/01/14 [phone]

## 2014-07-01 ENCOUNTER — Telehealth: Payer: Self-pay | Admitting: Family Medicine

## 2014-07-01 ENCOUNTER — Telehealth: Payer: Self-pay | Admitting: *Deleted

## 2014-07-01 ENCOUNTER — Encounter (INDEPENDENT_AMBULATORY_CARE_PROVIDER_SITE_OTHER): Payer: BLUE CROSS/BLUE SHIELD | Admitting: Adult Health

## 2014-07-01 LAB — STREP A DNA PROBE: Strep Gp A Direct, DNA Probe: NEGATIVE

## 2014-07-01 MED ORDER — POTASSIUM CHLORIDE CRYS ER 20 MEQ PO TBCR: 20.0000 meq | EXTENDED_RELEASE_TABLET | Freq: Every day | ORAL | Status: DC

## 2014-07-01 MED ORDER — CLINDAMYCIN HCL 150 MG PO CAPS: 300.0000 mg | ORAL_CAPSULE | Freq: Four times a day (QID) | ORAL | Status: DC

## 2014-07-01 NOTE — Discharge Instructions (Signed)
Please call your doctor for a followup appointment within 24-48 hours. When you talk to your doctor please let them know that you were seen in the emergency department and have them acquire all of your records so that they can discuss the findings with you and formulate a treatment plan to fully care for your new and ongoing problems. ° °

## 2014-07-01 NOTE — Telephone Encounter (Signed)
Metta Clinesatherine Lawrence nurse practitioner cardiology call. Stated that she was very concerned about how the patient was acting at their office. States that the patient walked in with the family member because the family was concerned that cardiac medicine was causing dementia. Apparently the patient did not even have an appointment. After discussion with the patient and the family cardiology ordered a CAT scan of the head and is requesting that we follow-up the patient on Friday for further discussion. Family is concerned about her safety and well-being. The patient has an appointment with ENT in the middle part of the day in the daughter states that they cannot calm until later in the day. We will do a follow-up regarding the CAT scan and potentially initiate further evaluation. It was recommended that family members stay with the patient and if any concerns immediately go to ER

## 2014-07-01 NOTE — Addendum Note (Signed)
Addended by: Jodelle GrossLAWRENCE, Emberleigh Reily M on: 07/01/2014 09:39 PM   Modules accepted: Level of Service

## 2014-07-01 NOTE — Progress Notes (Signed)
Cardiology Office Note   NO Show  

## 2014-07-01 NOTE — Telephone Encounter (Signed)
Refill complete 

## 2014-07-01 NOTE — Addendum Note (Signed)
Addended by: Kerney ElbePINNIX, LUKISHA G on: 07/01/2014 03:44 PM   Modules accepted: Orders

## 2014-07-02 ENCOUNTER — Encounter: Payer: Self-pay | Admitting: Family Medicine

## 2014-07-02 ENCOUNTER — Ambulatory Visit (INDEPENDENT_AMBULATORY_CARE_PROVIDER_SITE_OTHER): Payer: BLUE CROSS/BLUE SHIELD | Admitting: Family Medicine

## 2014-07-02 VITALS — BP 100/70 | Ht 63.0 in | Wt 143.4 lb

## 2014-07-02 DIAGNOSIS — F0391 Unspecified dementia with behavioral disturbance: Secondary | ICD-10-CM | POA: Diagnosis not present

## 2014-07-02 DIAGNOSIS — R413 Other amnesia: Secondary | ICD-10-CM | POA: Diagnosis not present

## 2014-07-02 DIAGNOSIS — F03918 Unspecified dementia, unspecified severity, with other behavioral disturbance: Secondary | ICD-10-CM

## 2014-07-02 NOTE — Progress Notes (Signed)
   Subjective:    Patient ID: Nichole May, female    DOB: 1950-05-04, 64 y.o.   MRN: [id number]  HPI Patient is here today to discuss possible dementia. This has been present for about 2 years now. Progressively getting worse.   Daughter Nichole Bonito([name]) thinks that it may be related to all the medications she is currently taking.   Getting forgetful and confused  Daughter takes care of the bills  Started with the heart attack  Seemed to worsen whenn fa passed away  Noticed a lot more difficulties  Pt repeats things frequently  Often repeats things a couple minutes later, gets confsed at times  Confused mvp card for [employer] cardPatient forgets a lot and stays confused.   There is family history of dementia and the patient's father.  Family is getting increasingly frustrated at this patient's confusion. Review of Systems No headache no loss of consciousness no chest pain no back pain no abdominal pain    Objective:   Physical Exam Pleasant no acute distress oriented 1. Lungs clear heart regular rhythm. Neuro exam intact.  MMSE patient scores 13 out of 30       Assessment & Plan:  Impression probable early dementia discussed plan press on with CT scan. Very long discussion held. Patient somewhat resistant to this. Appropriate blood work. Recheck next Friday for further discussion of results and potential intervention. Also nature of pseudodementia discussed, would certainly is a contributing component considering how things considerably worse and when patient's spouse passed away 40 minutes spent most in discussion. Also worsen this week because of acute throat infection with peritonsillar abscess. WSL

## 2014-07-03 ENCOUNTER — Encounter: Payer: Self-pay | Admitting: Family Medicine

## 2014-07-03 LAB — CULTURE, GROUP A STREP: Strep A Culture: NEGATIVE

## 2014-07-04 LAB — FOLATE: Folate: 6.5 ng/mL (ref >3.0)

## 2014-07-04 LAB — VITAMIN B12: Vitamin B-12: 191 pg/mL — ABNORMAL LOW (ref 211–946)

## 2014-07-04 LAB — TSH: TSH: 0.661 u[IU]/mL (ref 0.450–4.500)

## 2014-07-06 ENCOUNTER — Other Ambulatory Visit (HOSPITAL_COMMUNITY): Payer: BLUE CROSS/BLUE SHIELD

## 2014-07-06 ENCOUNTER — Other Ambulatory Visit: Payer: Self-pay | Admitting: *Deleted

## 2014-07-06 DIAGNOSIS — F0390 Unspecified dementia without behavioral disturbance: Secondary | ICD-10-CM

## 2014-07-07 ENCOUNTER — Ambulatory Visit (HOSPITAL_COMMUNITY): Payer: BLUE CROSS/BLUE SHIELD

## 2014-07-07 ENCOUNTER — Ambulatory Visit (HOSPITAL_COMMUNITY)
Admission: RE | Admit: 2014-07-07 | Discharge: 2014-07-07 | Disposition: A | Discharge status: Home / Self Care | Payer: BLUE CROSS/BLUE SHIELD | Source: Ambulatory Visit | Attending: Adult Health | Admitting: Adult Health

## 2014-07-07 DIAGNOSIS — F0390 Unspecified dementia without behavioral disturbance: Secondary | ICD-10-CM | POA: Diagnosis present

## 2014-07-13 ENCOUNTER — Ambulatory Visit (INDEPENDENT_AMBULATORY_CARE_PROVIDER_SITE_OTHER): Payer: BLUE CROSS/BLUE SHIELD | Admitting: Family Medicine

## 2014-07-13 ENCOUNTER — Encounter: Payer: Self-pay | Admitting: Family Medicine

## 2014-07-13 VITALS — BP 130/80 | Ht 63.0 in | Wt 150.2 lb

## 2014-07-13 DIAGNOSIS — F0391 Unspecified dementia with behavioral disturbance: Secondary | ICD-10-CM | POA: Diagnosis not present

## 2014-07-13 DIAGNOSIS — F03918 Unspecified dementia, unspecified severity, with other behavioral disturbance: Secondary | ICD-10-CM

## 2014-07-13 MED ORDER — VITAMIN B-12 1000 MCG PO TABS: ORAL_TABLET | ORAL | Status: DC

## 2014-07-13 NOTE — Progress Notes (Signed)
   Subjective:    Patient ID: Nichole May, female    DOB: Mar 22, 1950, 64 y.o.   MRN: [id number]  HPI Patient is here today for a follow up on memory loss and to discuss the recent CT scan results she had done. Results in the system. Patient has no new concerns at this time.   Patient has blood work which revealedB 12 borderline low. Not felt to be an etiology of patient's difficulties.  Patient's daughter reports patient still experiencing periods of confusion.  There is some family history of dementia.  Patient adamant that she does not have early dementia, claims no significant depression at this time  [phone] Review of Systems No chest pain no headache and back pain no abdominal pain no change in bowel habits    Objective:   Physical Exam Alert vital stable H&T normal. Lungs clear heart rare rhythm neuro intact.       Assessment & Plan:  Impression probable dementia early discussed with family. Daughter somewhat resistant to diagnosis concerned that recent medications may be contributing. Patient resistant to diagnosis also. Plan recommend vitamin B-12 supplement. Neurology referral. Of note patient scored 12 out of 30 on MMSE test. Follow-up regular appointment in 3 months. WSL

## 2014-08-02 ENCOUNTER — Other Ambulatory Visit: Payer: Self-pay | Admitting: Family Medicine

## 2014-09-06 ENCOUNTER — Encounter: Payer: Self-pay | Admitting: Neurology

## 2014-09-06 ENCOUNTER — Ambulatory Visit (INDEPENDENT_AMBULATORY_CARE_PROVIDER_SITE_OTHER): Payer: Medicare Other | Admitting: Neurology

## 2014-09-06 VITALS — BP 138/84 | HR 74 | Resp 16 | Ht 63.0 in | Wt 145.6 lb

## 2014-09-06 DIAGNOSIS — F0391 Unspecified dementia with behavioral disturbance: Secondary | ICD-10-CM

## 2014-09-06 DIAGNOSIS — E538 Deficiency of other specified B group vitamins: Secondary | ICD-10-CM

## 2014-09-06 DIAGNOSIS — F0281 Dementia in other diseases classified elsewhere with behavioral disturbance: Secondary | ICD-10-CM | POA: Insufficient documentation

## 2014-09-06 DIAGNOSIS — F02818 Dementia in other diseases classified elsewhere, unspecified severity, with other behavioral disturbance: Secondary | ICD-10-CM | POA: Insufficient documentation

## 2014-09-06 DIAGNOSIS — F03918 Unspecified dementia, unspecified severity, with other behavioral disturbance: Secondary | ICD-10-CM | POA: Insufficient documentation

## 2014-09-06 DIAGNOSIS — G309 Alzheimer's disease, unspecified: Secondary | ICD-10-CM | POA: Insufficient documentation

## 2014-09-06 DIAGNOSIS — F329 Major depressive disorder, single episode, unspecified: Secondary | ICD-10-CM

## 2014-09-06 DIAGNOSIS — F32A Depression, unspecified: Secondary | ICD-10-CM | POA: Insufficient documentation

## 2014-09-06 NOTE — Patient Instructions (Signed)
1. We will start Namenda XR.  Take as directed.  Call for refills.   2.  In one month, if tolerating the Namenda, call and we can add another medication called Aricept. 3.  At this point, I don't think Nichole May is able to drive. 4.  Consider another antidepressant for depression 5.  Follow up in 6 months.

## 2014-09-06 NOTE — Progress Notes (Signed)
NEUROLOGY CONSULTATION NOTE  Nichole May MRN: [id number] DOB: 17-Sep-1950  Referring provider: [name] Primary care provider: [name]  Reason for consult:  Dementia  HISTORY OF PRESENT ILLNESS: Nichole May is a 64 year old right-handed female with diabetes, hypertension, and CAD who presents for dementia.  She is accompanied by her daughter who provides some history.  Images of head CT and labs reviewed.  Her family began noticing problems with anxiety and irritability in October 2013, after she had an MI and needed stents.  Prior to that, she would frequently misplace objects.  She worked as a Lawyer for 30 years.  She went back to work the following month but had to leave as she had trouble with irritability and patience.  Over the next couple of years, her husband began to notice problems with memory and executive functioning.  She always used to manage the finances, but then she had trouble with it.  She would incorrectly calculate or overpay bills.  She also has exhibited signs of dressing apraxia.  She once took 20 minutes to put on a shirt.  Symptoms gradually progressed, but it particularly got worse after her husband passed away in 07-Jul-2013.    She currently lives alone, but her family lives nearby, even within walking distance.  She is able to bath and use the toilet herself.  Her sister comes over daily to set up her pillbox and make sure she is taking her medication.  Her son comes over to perform yard work and to [employer] for her.  Her daughter manages her finances.  The only time she drives is to go down to the next street to see her son.  She is rarely alone in the house.  She is very irritable and depressed.  She tried citalopram which made her feel bad.  She denies hallucinations or delusions.  She does not have trouble remembering names or faces of people she knows.  She does not appear to be disoriented when she is in the car.  There is no known family history of dementia.  Labs  from 07/03/14 revealed B12 deficiency of 191.  TSH was 0.661.  She was started on B12 daily.  CT of the head showed mild atrophy and chronic small vessel ischemic changes.  PAST MEDICAL HISTORY: Past Medical History  Diagnosis Date  . Diabetes mellitus   . Hypertension   . Tobacco abuse   . CAD (coronary artery disease)     10/08/11 Inf STEMI  . Hyperlipidemia   . Memory loss     PAST SURGICAL HISTORY: Past Surgical History  Procedure Laterality Date  . Denies    . Coronary angioplasty with stent placement    . Left heart catheterization with coronary angiogram N/A 10/08/2011    Procedure: LEFT HEART CATHETERIZATION WITH CORONARY ANGIOGRAM;  Surgeon: Kathleene Hazel, MD;  Location: [facility] CATH LAB;  Service: Cardiovascular;  Laterality: N/A;    MEDICATIONS: Current Outpatient Prescriptions on File Prior to Visit  Medication Sig Dispense Refill  . acetaminophen (TYLENOL) 500 MG tablet Take 500 mg by mouth every 6 (six) hours as needed for pain.    Marland Kitchen aspirin 81 MG tablet Take 81 mg by mouth daily.    Marland Kitchen BAYER MICROLET LANCETS lancets USE AS DIRECTED THREE TIMES DAILY. 100 each 0  . clopidogrel (PLAVIX) 75 MG tablet Take 1 tablet (75 mg total) by mouth daily. 90 tablet 3  . FREESTYLE TEST STRIPS test strip USE AS  DIRECTED. 50 each 0  . hydrochlorothiazide (MICROZIDE) 12.5 MG capsule Take 1 capsule (12.5 mg total) by mouth daily. 30 capsule 5  . lisinopril (PRINIVIL,ZESTRIL) 40 MG tablet TAKE ONE TABLET BY MOUTH ONCE DAILY. 30 tablet 5  . lovastatin (MEVACOR) 20 MG tablet TAKE ONE TABLET BY MOUTH AT BEDTIME. 30 tablet 5  . metFORMIN (GLUCOPHAGE) 500 MG tablet Take 1 tablet (500 mg total) by mouth daily with breakfast. 30 tablet 5  . nitroGLYCERIN (NITROSTAT) 0.4 MG SL tablet Place 0.4 mg under the tongue every 5 (five) minutes as needed for chest pain.    . potassium chloride SA (KLOR-CON M20) 20 MEQ tablet Take 1 tablet (20 mEq total) by mouth daily. 90 tablet 3  . vitamin  B-12 (CYANOCOBALAMIN) 1000 MCG tablet One q am 30 tablet 6  . citalopram (CELEXA) 20 MG tablet Take 1 tablet (20 mg total) by mouth daily. (Patient not taking: Reported on 09/06/2014) 30 tablet 5  . clindamycin (CLEOCIN) 150 MG capsule Take 2 capsules (300 mg total) by mouth 4 (four) times daily. May dispense as 150mg  capsules (Patient not taking: Reported on 09/06/2014) 80 capsule 0  . nitroGLYCERIN (NITROSTAT) 0.4 MG SL tablet Place 1 tablet (0.4 mg total) under the tongue every 5 (five) minutes x 3 doses as needed for chest pain. 25 tablet 3   No current facility-administered medications on file prior to visit.    ALLERGIES: No Known Allergies  FAMILY HISTORY: Family History  Problem Relation Age of Onset  . Heart attack Father   . Diabetes Mother   . Stroke Mother   . Stroke Father     SOCIAL HISTORY: History   Social History  . Marital Status: Married    Spouse Name: N/A  . Number of Children: N/A  . Years of Education: N/A   Occupational History  . Not on file.   Social History Main Topics  . Smoking status: Current Some Day Smoker -- 0.50 packs/day for 15 years    Types: Cigarettes  . Smokeless tobacco: Never Used  . Alcohol Use: No  . Drug Use: No  . Sexual Activity: No   Other Topics Concern  . Not on file   Social History Narrative    REVIEW OF SYSTEMS: Constitutional: No fevers, chills, or sweats, no generalized fatigue, change in appetite Eyes: No visual changes, double vision, eye pain Ear, nose and throat: No hearing loss, ear pain, nasal congestion, sore throat Cardiovascular: No chest pain, palpitations Respiratory:  No shortness of breath at rest or with exertion, wheezes GastrointestinaI: No nausea, vomiting, diarrhea, abdominal pain, fecal incontinence Genitourinary:  No dysuria, urinary retention or frequency Musculoskeletal:  No neck pain, back pain Integumentary: No rash, pruritus, skin lesions Neurological: as above Psychiatric: No  depression, insomnia, anxiety Endocrine: No palpitations, fatigue, diaphoresis, mood swings, change in appetite, change in weight, increased thirst Hematologic/Lymphatic:  No anemia, purpura, petechiae. Allergic/Immunologic: no itchy/runny eyes, nasal congestion, recent allergic reactions, rashes  PHYSICAL EXAM: Filed Vitals:   09/06/14 1433  BP: 138/84  Pulse: 74  Resp: 16   General: No acute distress.  Irritable Head:  Normocephalic/atraumatic Eyes:  fundi unremarkable, without vessel changes, exudates, hemorrhages or papilledema. Neck: supple, no paraspinal tenderness, full range of motion Back: No paraspinal tenderness Heart: regular rate and rhythm Lungs: Clear to auscultation bilaterally. Vascular: No carotid bruits. Neurological Exam: Mental status: alert and oriented to person, place, and time, recent memory poor, remote memory intact, fund of knowledge intact, attention and  concentration intact, speech fluent and not dysarthric, lAble to name.  Some difficulty repeating and with naming fluency.  Some trouble following some simple and complex commands.  Montreal Cognitive Assessment  09/06/2014  Visuospatial/ Executive (0/5) 1  Naming (0/3) 3  Attention: Read list of digits (0/2) 1  Attention: Read list of letters (0/1) 0  Attention: Serial 7 subtraction starting at 100 (0/3) 0  Language: Repeat phrase (0/2) 0  Language : Fluency (0/1) 0  Abstraction (0/2) 1  Delayed Recall (0/5) 0  Orientation (0/6) 3  Total 9  Adjusted Score (based on education) 10   Cranial nerves: CN I: not tested CN II: pupils equal, round and reactive to light, visual fields intact, fundi unremarkable, without vessel changes, exudates, hemorrhages or papilledema. CN III, IV, VI:  full range of motion, no nystagmus, no ptosis CN V: facial sensation intact CN VII: upper and lower face symmetric CN VIII: hearing intact CN IX, X: gag intact, uvula midline CN XI: sternocleidomastoid and trapezius  muscles intact CN XII: tongue midline Bulk & Tone: normal, no fasciculations. Motor:  5/5 throughout Sensation:  Reduced temperature sensation in feet.  Vibration sensation intact. Deep Tendon Reflexes:  2+ throughout, toes downgoing Finger to nose testing:  intact Heel to shin:  intact Gait:  Normal station and stride.  Unable to assess tandem walking due to difficulty comprehending. Romberg negative.  IMPRESSION: 1.  Dementia with behavioral changes.  Difficult to completely categorize.  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  It's also hard to assess how much depression may be playing a role.  Ideally, I would like her to get neurocognitive testing, but she would likely not tolerate it. 2.  Depression 3.  B12 deficiency  PLAN: 1.  We will start Namenda XR titration to  daily.  If tolerating, then in one month would add Aricept. 2.  Given her poor performance on the [facility], especially regarding the Trail Making Test, I informed the patient and her daughter that she should not be driving.  If she would like to contest this, I informed them that she can take a formal driving evaluation as given by an occupational therapist. 3.  After she has started Namenda (and then Aricept), I would recommend starting an antidepressant if irritability continues 4.  Follow up in 6 months.  Thank you for allowing me to take part in the care of this patient.  Shon Millet, DO  CC:  Lilyan Punt, MD

## 2014-09-28 ENCOUNTER — Telehealth: Payer: Self-pay | Admitting: *Deleted

## 2014-09-28 NOTE — Telephone Encounter (Signed)
I have left a message for patients daughter to contact office . I am returning her call

## 2014-09-28 NOTE — Telephone Encounter (Signed)
[name] patients daughter called to speak with a nurse about her mothers medication (?) Call back number [phone]

## 2014-09-30 ENCOUNTER — Telehealth: Payer: Self-pay | Admitting: *Deleted

## 2014-09-30 NOTE — Telephone Encounter (Signed)
See [name] , CMA telephone note on 09/30/14  This patient's daughter states they will not be able to afford the medication Namenda XR with her  Insurance they are still left paying 500 dollars . Please advise if you have any suggestions

## 2014-09-30 NOTE — Telephone Encounter (Signed)
Patient daughter called she does not want you to call in the Rx her mother an nor afford it she woul like for you to give her a call back asap Call back number [phone] christie

## 2014-09-30 NOTE — Telephone Encounter (Signed)
Patients daughter [name] called stating patient is doing well on Namenda XR she is asking do you want to add another medication with this ? She also states they really cannot afford this medication for the patient she has 3 pills left .Please advise

## 2014-10-01 NOTE — Telephone Encounter (Signed)
We can start her on the aricept. We will start donepezil (Aricept)  daily for four weeks.  If you are tolerating the medication, then after four weeks, we will increase the dose to  daily.  Side effects include nausea, vomiting, diarrhea, vivid dreams, and muscle cramps.  Please call the clinic if you experience any of these symptoms.

## 2014-10-04 ENCOUNTER — Other Ambulatory Visit: Payer: Self-pay | Admitting: Family Medicine

## 2014-10-06 ENCOUNTER — Encounter: Payer: Self-pay | Admitting: Physician Assistant

## 2014-10-06 ENCOUNTER — Ambulatory Visit (INDEPENDENT_AMBULATORY_CARE_PROVIDER_SITE_OTHER): Payer: Medicare Other | Admitting: Physician Assistant

## 2014-10-06 ENCOUNTER — Other Ambulatory Visit: Payer: Self-pay | Admitting: Physician Assistant

## 2014-10-06 VITALS — BP 140/76 | HR 58 | Ht 64.0 in | Wt 147.8 lb

## 2014-10-06 DIAGNOSIS — I10 Essential (primary) hypertension: Secondary | ICD-10-CM

## 2014-10-06 DIAGNOSIS — I2510 Atherosclerotic heart disease of native coronary artery without angina pectoris: Secondary | ICD-10-CM

## 2014-10-06 DIAGNOSIS — I519 Heart disease, unspecified: Secondary | ICD-10-CM

## 2014-10-06 DIAGNOSIS — E785 Hyperlipidemia, unspecified: Secondary | ICD-10-CM

## 2014-10-06 DIAGNOSIS — E876 Hypokalemia: Secondary | ICD-10-CM

## 2014-10-06 DIAGNOSIS — E871 Hypo-osmolality and hyponatremia: Secondary | ICD-10-CM

## 2014-10-06 NOTE — Assessment & Plan Note (Signed)
Lipid panel from March 2016 reviewed. Her triglycerides were elevated. She like to try adjusting her diet before starting another medication. She is diabetic and eats a lot of sweets. Will need repeat lipids in March.

## 2014-10-06 NOTE — Progress Notes (Signed)
Cardiology Office Note   Date:  10/06/2014   ID:  Nichole May, Nichole May 1950/04/07, MRN [id number]  PCP:  [name], MD  Cardiologist:  To be established in [place] office with Dr. branch  Chief Complaint:yearly f/u    History of Present Illness: Nichole May is a 64 y.o. female who presents for yearly follow-up. She has a history of CAD status post STEMI in 2013 treated with drug-eluting stent to the proximal RCA, moderate disease in the mid LAD and 70% at the takeoff of the small diagonal branch. She has chronic occlusion of the circumflex with collateral filling. EF was 45%. Follow-up 2-D echo in 2014 EF 50% with severe hypokinesis to akinesis of the basal mid inferior and inferior septal myocardium. Moderate hypokinesis inferolateral. Patient also has a history of diabetes mellitus, hypertension and tobacco abuse. She has had bradycardia on beta blockers in the past.  Since her last office visit she has been diagnosed with dementia with some behavioral changes. She is being followed by Dr. Everlena Cooper. CT of head 06/2014 showed mild atrophy and chronic small vessel ischemic changes. Labs from May 2016 reviewed potassium is low at 3.1 otherwise labs seems stable including TSH.  Patient comes in today, accompanied by her son. She denies any chest pain, palpitations, dyspnea, dyspnea on exertion, dizziness or presyncope. She walks around her house but doesn't get much other exercise. She does not smoke. In reviewing her labs her triglycerides were 297 back in March. She has a diabetic but she eats a lot of sweets. She says she will change this.    Past Medical History  Diagnosis Date  . Diabetes mellitus   . Hypertension   . Tobacco abuse   . CAD (coronary artery disease)     10/08/11 Inf STEMI  . Hyperlipidemia   . Memory loss     Past Surgical History  Procedure Laterality Date  . Denies    . Coronary angioplasty with stent placement    . Left heart catheterization with coronary  angiogram N/A 10/08/2011    Procedure: LEFT HEART CATHETERIZATION WITH CORONARY ANGIOGRAM;  Surgeon: Kathleene Hazel, MD;  Location: [facility] CATH LAB;  Service: Cardiovascular;  Laterality: N/A;     Current Outpatient Prescriptions  Medication Sig Dispense Refill  . acetaminophen (TYLENOL) 500 MG tablet Take 500 mg by mouth every 6 (six) hours as needed for pain.    Marland Kitchen aspirin 81 MG tablet Take 81 mg by mouth daily.    Marland Kitchen BAYER MICROLET LANCETS lancets USE AS DIRECTED THREE TIMES DAILY. 100 each 0  . citalopram (CELEXA) 20 MG tablet Take 1 tablet (20 mg total) by mouth daily. 30 tablet 5  . clindamycin (CLEOCIN) 150 MG capsule Take 2 capsules (300 mg total) by mouth 4 (four) times daily. May dispense as  capsules 80 capsule 0  . clopidogrel (PLAVIX) 75 MG tablet Take 1 tablet (75 mg total) by mouth daily. 90 tablet 3  . FREESTYLE TEST STRIPS test strip USE AS DIRECTED. 50 each 0  . hydrochlorothiazide (MICROZIDE) 12.5 MG capsule TAKE 1 CAPSULE BY MOUTH ONCE DAILY. 30 capsule 0  . lisinopril (PRINIVIL,ZESTRIL) 40 MG tablet TAKE ONE TABLET BY MOUTH ONCE DAILY. 30 tablet 0  . lovastatin (MEVACOR) 20 MG tablet TAKE ONE TABLET BY MOUTH AT BEDTIME. 30 tablet 5  . metFORMIN (GLUCOPHAGE) 500 MG tablet TAKE 1 TABLET BY MOUTH ONCE DAILY WITH BREAKFAST. 30 tablet 0  . nitroGLYCERIN (NITROSTAT) 0.4 MG SL tablet Place 0.4  mg under the tongue every 5 (five) minutes as needed for chest pain.    . potassium chloride SA (KLOR-CON M20) 20 MEQ tablet Take 1 tablet (20 mEq total) by mouth daily. 90 tablet 3  . vitamin B-12 (CYANOCOBALAMIN) 1000 MCG tablet One q am 30 tablet 6  . nitroGLYCERIN (NITROSTAT) 0.4 MG SL tablet Place 1 tablet (0.4 mg total) under the tongue every 5 (five) minutes x 3 doses as needed for chest pain. 25 tablet 3   No current facility-administered medications for this visit.    Allergies:   Review of patient's allergies indicates no known allergies.    Social History:  The  patient  reports that she has been smoking Cigarettes.  She has a 7.5 pack-year smoking history. She has never used smokeless tobacco. She reports that she does not drink alcohol or use illicit drugs.   Family History:  The patient's family history includes Diabetes in her mother; Heart attack in her father; Stroke in her father and mother.    ROS:  Please see the history of present illness.   Otherwise, review of systems are positive for none.   All other systems are reviewed and negative.    PHYSICAL EXAM: VS:  BP 140/76 mmHg  Pulse 58  Ht 5\' 4"  (1.626 m)  Wt 147 lb 12.8 oz (67.042 kg)  BMI 25.36 kg/m2  SpO2 96% , BMI Body mass index is 25.36 kg/(m^2). GEN: Well nourished, well developed, in no acute distress Neck: no JVD, HJR, carotid bruits, or masses Cardiac:  RRR; no murmurs,gallop, rubs, thrill or heave,  Respiratory:  clear to auscultation bilaterally, normal work of breathing GI: soft, nontender, nondistended, + BS MS: no deformity or atrophy Extremities: without cyanosis, clubbing, edema, good distal pulses bilaterally.  Skin: warm and dry, no rash Neuro:  Strength and sensation are intact    EKG:  EKG is ordered today. The ekg ordered today demonstrates normal sinus rhythm old anterior MI   Recent Labs: 06/30/2014: ALT 9*; BUN 16; Creatinine, Ser 0.59; Hemoglobin 12.2; Platelets 187; Potassium 3.1*; Sodium 142 07/03/2014: TSH 0.661    Lipid Panel    Component Value Date/Time   CHOL 163 04/27/2014 1542   CHOL 192 10/07/2013 0912   TRIG 297* 04/27/2014 1542   HDL 42 04/27/2014 1542   HDL 30* 10/07/2013 0912   CHOLHDL 3.9 04/27/2014 1542   CHOLHDL 6.4 10/07/2013 0912   VLDL 49* 10/07/2013 0912   LDLCALC 62 04/27/2014 1542   LDLCALC 113* 10/07/2013 0912      Wt Readings from Last 3 Encounters:  10/06/14 147 lb 12.8 oz (67.042 kg)  09/06/14 145 lb 9.6 oz (66.044 kg)  07/13/14 150 lb 4 oz (68.153 kg)      Other studies Reviewed: Additional studies/  records that were reviewed today include and review of the records demonstrates:  2-D echo 02/2012 Study Conclusions  - Left ventricle: The cavity size was at the upper limits of   normal. Mild to moderate LVH with thinning of the inferior   myocardium associated with hypokinesis to akinesis   suggesting prior infarction. Mild to moderate septal   hypertrophy. Systolic function was low normal. The   estimated ejection fraction wasapproximately 50%. Severe   hypokinesis to akinesisof the basal-mid inferior and   inferoseptalmyocardium. Moderate hypokinesis   inferolaterally. - Left atrium: The atrium was mildly to moderately dilated. - Right ventricle: The cavity size was normal. Wall   thickness was at the upper limits of  normal. - Atrial septum: No defect or patent foramen ovale was   identified. Impressions:  - Compared to the prior study, estimated EF has increased  Echo: 10/09/2011 Study Conclusions - Left ventricle: The cavity size was normal. Wall thickness was increased in a pattern of mild LVH. Systolic function was mildly to moderately reduced. The estimated ejection fraction was in the range of 40% to 45%. Basal to mid inferior and inferoseptal severe hypokinesis to akinesis. Basal to mid posterior severe hypokinesis. Doppler parameters are consistent with abnormal left ventricular relaxation (grade 1 diastolic dysfunction). - Aortic valve: Poorly visualized. Probably trileaflet. There was no stenosis. - Mitral valve: No significant regurgitation. - Right ventricle: The cavity size was mildly dilated. Systolic function was mildly reduced. - Right atrium: The atrium was mildly dilated. - Pulmonary arteries: No complete TR doppler jet so unable to estimate PA systolic pressure. - Systemic veins: IVC measured 2.2 cm with some respirophasic variation, suggesting RA pressure 10 mmHg. Impressions: - Normal LV size with midl LV hypertrophy. EF 40-45% with basal to mid  inferior, inferoseptal, and posterior severe hypokinesis to akinesis. Mildly dilated RV with mildly decreased RV systolic function. No significant valvular Abnormalities.  Cardiac Cath: 10/08/2011 Left main: No obstructive disease noted.  Left Anterior Descending Artery: Large caliber vessel that courses to the apex. The mid LAD has a 70% stenosis at the takeoff of the small diagonal branch. The distal LAD has mild plaque disease.   Circumflex Artery: Moderate sized vessel with 100% occlusion in the mid segment just after the small first obtuse marginal branch. The distal Circumflex and second marginal branch fills from left to left collaterals.   Right Coronary Artery: 100% proximal occlusion.   Left Ventricular Angiogram: LVEF =40% with inferior wall hypokinesis.   Impression:   1. Acute inferior STEMI secondary to occluded proximal RCA, now s/p DES x 1 proximal RCA   2. Moderate disease in mid LAD   3. Chronic occlusion of Circumflex with collateral filling   4. Segmental LV systolic dysfunction.      ASSESSMENT AND PLAN: CAD (coronary artery disease) Patient is 3 years out from her inferior STEMI treated with drug-eluting stent to the RCA. She also had moderate disease in the LAD and total circumflex. EF improved from 45% up to 50% on echo in 2014. Patient is asymptomatic and doing well. Continue Plavix, aspirin, lisinopril. Patient has history of bradycardia on beta blocker. Follow-up in 6 months with Dr. branch to become established.  Essential hypertension, benign Blood pressure controlled  Hyperlipidemia LDL goal <70 Lipid panel from March 2016 reviewed. Her triglycerides were elevated. She like to try adjusting her diet before starting another medication. She is diabetic and eats a lot of sweets. Will need repeat lipids in March.  Hypokalemia Patient's potassium was 3.1 in May. She does take potassium. We'll repeat labs to follow-up on this.  Systolic dysfunction EF improved  to 50% on echo in 2014. No evidence of heart failure.     Signed, Jacolyn Reedy, PA-C  10/06/2014 2:22 PM    [facility] [address] [place], [place], [place]  [address] Phone: [phone]; Fax: [phone]

## 2014-10-06 NOTE — Assessment & Plan Note (Signed)
Patient's potassium was 3.1 in May. She does take potassium. We'll repeat labs to follow-up on this.

## 2014-10-06 NOTE — Assessment & Plan Note (Signed)
EF improved to 50% on echo in 2014. No evidence of heart failure.

## 2014-10-06 NOTE — Assessment & Plan Note (Signed)
Blood pressure controlled. 

## 2014-10-06 NOTE — Patient Instructions (Signed)
Your physician wants you to follow-up in: 6 months with Dr. Wyline Mood. You will receive a reminder letter in the mail two months in advance. If you don't receive a letter, please call our office to schedule the follow-up appointment.  Your physician recommends that you continue on your current medications as directed. Please refer to the Current Medication list given to you today.  Your physician recommends that you have lab work done.   Thank you for choosing [facility]!

## 2014-10-06 NOTE — Assessment & Plan Note (Signed)
Patient is 3 years out from her inferior STEMI treated with drug-eluting stent to the RCA. She also had moderate disease in the LAD and total circumflex. EF improved from 45% up to 50% on echo in 2014. Patient is asymptomatic and doing well. Continue Plavix, aspirin, lisinopril. Patient has history of bradycardia on beta blocker. Follow-up in 6 months with Dr. branch to become established.

## 2014-10-07 LAB — BASIC METABOLIC PANEL
BUN: 14 mg/dL (ref 7–25)
CALCIUM: 9.4 mg/dL (ref 8.6–10.4)
CO2: 26 mmol/L (ref 20–31)
Chloride: 101 mmol/L (ref 98–110)
Creat: 0.54 mg/dL (ref 0.50–0.99)
Glucose, Bld: 108 mg/dL — ABNORMAL HIGH (ref 65–99)
Potassium: 3.7 mmol/L (ref 3.5–5.3)
Sodium: 144 mmol/L (ref 135–146)

## 2014-10-07 NOTE — Addendum Note (Signed)
Addended by: Kerney Elbe on: 10/07/2014 10:44 AM   Modules accepted: Orders

## 2014-10-12 ENCOUNTER — Encounter: Payer: Self-pay | Admitting: *Deleted

## 2014-10-19 ENCOUNTER — Telehealth: Payer: Self-pay | Admitting: Physician Assistant

## 2014-10-19 NOTE — Telephone Encounter (Signed)
PT sister made aware of PT's lab work. She voiced understanding.

## 2014-10-19 NOTE — Telephone Encounter (Signed)
pls call the pt's sister concerning her lab work

## 2014-10-28 ENCOUNTER — Ambulatory Visit (INDEPENDENT_AMBULATORY_CARE_PROVIDER_SITE_OTHER): Payer: Medicare Other | Admitting: Family Medicine

## 2014-10-28 ENCOUNTER — Encounter: Payer: Self-pay | Admitting: Family Medicine

## 2014-10-28 VITALS — BP 112/72 | Ht 63.0 in | Wt 147.4 lb

## 2014-10-28 DIAGNOSIS — Z79899 Other long term (current) drug therapy: Secondary | ICD-10-CM

## 2014-10-28 DIAGNOSIS — E0842 Diabetes mellitus due to underlying condition with diabetic polyneuropathy: Secondary | ICD-10-CM

## 2014-10-28 DIAGNOSIS — E119 Type 2 diabetes mellitus without complications: Secondary | ICD-10-CM

## 2014-10-28 DIAGNOSIS — E785 Hyperlipidemia, unspecified: Secondary | ICD-10-CM

## 2014-10-28 DIAGNOSIS — F0391 Unspecified dementia with behavioral disturbance: Secondary | ICD-10-CM

## 2014-10-28 DIAGNOSIS — I2510 Atherosclerotic heart disease of native coronary artery without angina pectoris: Secondary | ICD-10-CM

## 2014-10-28 DIAGNOSIS — F03918 Unspecified dementia, unspecified severity, with other behavioral disturbance: Secondary | ICD-10-CM

## 2014-10-28 DIAGNOSIS — Z23 Encounter for immunization: Secondary | ICD-10-CM

## 2014-10-28 LAB — POCT GLYCOSYLATED HEMOGLOBIN (HGB A1C): Hemoglobin A1C: 5

## 2014-10-28 MED ORDER — POTASSIUM CHLORIDE CRYS ER 20 MEQ PO TBCR: 20.0000 meq | EXTENDED_RELEASE_TABLET | Freq: Every day | ORAL | Status: DC

## 2014-10-28 MED ORDER — LISINOPRIL 40 MG PO TABS: 40.0000 mg | ORAL_TABLET | Freq: Every day | ORAL | Status: DC

## 2014-10-28 MED ORDER — HYDROCHLOROTHIAZIDE 12.5 MG PO CAPS: ORAL_CAPSULE | ORAL | Status: DC

## 2014-10-28 MED ORDER — LOVASTATIN 20 MG PO TABS: 20.0000 mg | ORAL_TABLET | Freq: Every day | ORAL | Status: DC

## 2014-10-28 MED ORDER — METFORMIN HCL 500 MG PO TABS: ORAL_TABLET | ORAL | Status: DC

## 2014-10-28 MED ORDER — CLOPIDOGREL BISULFATE 75 MG PO TABS: 75.0000 mg | ORAL_TABLET | Freq: Every day | ORAL | Status: DC

## 2014-10-28 NOTE — Progress Notes (Signed)
   Subjective:    Patient ID: Nichole May, female    DOB: 09-16-1950, 64 y.o.   MRN: [id number]  Diabetes She presents for her follow-up diabetic visit. She has type 2 diabetes mellitus. No MedicAlert identification noted. She is compliant with treatment all of the time. She has not had a previous visit with a dietitian. She does not see a podiatrist.Eye exam is not current.   Patient would like to discuss leg pain.Right knee pain, for awhile, not sure how long. No injury known worse with certain motions.  Results for orders placed or performed in visit on 10/28/14  POCT HgB A1C  Result Value Ref Range   Hemoglobin A1C 5.0    Not always eating the best  Gets all her meds organized so she wont miss  Went to see the neurologist. They're working diagnosis is dementia. They started on medication. She could not tolerate this. Family is frustrated. The are not very accepting of the dementia diagnosis. The patient is definitely not accepting of potential early dementia diagnosis.  Patient's sister reports patient is compliant with medication.  Compliant with lipid medicine. No obvious side effects watching fat diet. Next  Compliant blood pressure medicine. Watching salt in diet. Uses organizer to stay with the medicine.     Review of Systems No headache no chest pain no back pain no abdominal pain no change in bowel habits no blood in stool    Objective:   Physical Exam  Alert vitals stable blood pressure good on repeat HEENT normal. Lungs clear. Heart regular in rhythm. Ankles without edema.   Right knee good range of motion     Assessment & Plan:  Impression 1 type 2 diabetes good control discussed #2 early dementia discussed. Family and patient quite resistant. Patient in fact become somewhat agitated at the suggestion. I advised sister to notify the rest the family extremely important to follow-up with the neurologist as recommended by the neurologist #3 hypertension good  control #4 hyperlipidemia status uncertain #5 right leg pain nonspecific plan appropriate blood work. Follow-up in 6 months. Flu shot today. Advised the patient's sisters for the family to stay in close contact with the patient. Absolute no driving of course [place]

## 2014-10-29 LAB — LIPID PANEL
Chol/HDL Ratio: 5 ratio units — ABNORMAL HIGH (ref 0.0–4.4)
Cholesterol, Total: 154 mg/dL (ref 100–199)
HDL: 31 mg/dL — ABNORMAL LOW (ref >39)
TRIGLYCERIDES: 458 mg/dL — AB (ref 0–149)

## 2014-10-29 LAB — HEPATIC FUNCTION PANEL
ALBUMIN: 4.2 g/dL (ref 3.6–4.8)
ALK PHOS: 45 IU/L (ref 39–117)
ALT: 8 IU/L (ref 0–32)
AST: 10 IU/L (ref 0–40)
BILIRUBIN TOTAL: 0.5 mg/dL (ref 0.0–1.2)
Bilirubin, Direct: 0.13 mg/dL (ref 0.00–0.40)
Total Protein: 6.5 g/dL (ref 6.0–8.5)

## 2014-11-08 ENCOUNTER — Encounter: Payer: Self-pay | Admitting: Family Medicine

## 2015-01-20 ENCOUNTER — Encounter: Payer: Self-pay | Admitting: Family Medicine

## 2015-01-20 ENCOUNTER — Ambulatory Visit (INDEPENDENT_AMBULATORY_CARE_PROVIDER_SITE_OTHER): Payer: Medicare Other | Admitting: Family Medicine

## 2015-01-20 VITALS — BP 132/70 | Temp 99.9°F | Ht 63.0 in | Wt 138.0 lb

## 2015-01-20 DIAGNOSIS — I2510 Atherosclerotic heart disease of native coronary artery without angina pectoris: Secondary | ICD-10-CM | POA: Diagnosis not present

## 2015-01-20 DIAGNOSIS — J189 Pneumonia, unspecified organism: Secondary | ICD-10-CM | POA: Diagnosis not present

## 2015-01-20 DIAGNOSIS — M545 Low back pain: Secondary | ICD-10-CM

## 2015-01-20 DIAGNOSIS — R05 Cough: Secondary | ICD-10-CM

## 2015-01-20 DIAGNOSIS — J181 Lobar pneumonia, unspecified organism: Secondary | ICD-10-CM

## 2015-01-20 DIAGNOSIS — R059 Cough, unspecified: Secondary | ICD-10-CM

## 2015-01-20 LAB — POCT URINALYSIS DIPSTICK
PH UA: 5
RBC UA: NEGATIVE
Spec Grav, UA: 1.025

## 2015-01-20 MED ORDER — CEFTRIAXONE SODIUM 1 G IJ SOLR
500.0000 mg | Freq: Once | INTRAMUSCULAR | Status: AC
Start: 2015-01-20 — End: 2015-01-20
  Administered 2015-01-20: 500 mg via INTRAMUSCULAR

## 2015-01-20 MED ORDER — AMOXICILLIN-POT CLAVULANATE 875-125 MG PO TABS: 1.0000 | ORAL_TABLET | Freq: Two times a day (BID) | ORAL | Status: DC

## 2015-01-20 NOTE — Progress Notes (Signed)
   Subjective:    Patient ID: Nichole May, female    DOB: 03-Jun-1950, 64 y.o.   MRN: [id number]  Cough This is a new problem. The current episode started 1 to 4 weeks ago. The problem has been unchanged. The cough is non-productive. Associated symptoms include ear pain and a sore throat. Associated symptoms comments: Back pain . Nothing aggravates the symptoms. Treatments tried: tylenol. The treatment provided no relief.    No sig ear pain  Back has aching with a lot of coughing  Hurt with bending patient does have history of diabetes. Also history of chronic smoking. Had cough and congestion for a couple weeks. Last several days has developed more cough productive in nature. Yellowish phlegm at times.  Patient sisters hyper focused on patient's dark urine and diffuse back pain. Oral intake less but not absent  Review of Systems  HENT: Positive for ear pain and sore throat.   Respiratory: Positive for cough.    no abdominal pain no dysuria     Objective:   Physical Exam  Alert mild malaise. Low-grade fever HEENT mild nasal congestion lungs left basilar crackles no CVA tenderness heart rare rhythm abdomen benign urinalysis unremarkable      Assessment & Plan:  Impression left lower lobe pneumonia with numerous risk factors plan injection today start oral antibiotics warning signs discussed carefully. Multiple questions answered WSL

## 2015-02-04 ENCOUNTER — Encounter (HOSPITAL_COMMUNITY): Payer: Self-pay | Admitting: Emergency Medicine

## 2015-02-04 ENCOUNTER — Emergency Department (HOSPITAL_COMMUNITY)
Admission: EM | Admit: 2015-02-04 | Discharge: 2015-02-04 | Discharge status: Against Medical Advice | Payer: Medicare Other | Attending: Emergency Medicine | Admitting: Emergency Medicine

## 2015-02-04 DIAGNOSIS — R110 Nausea: Secondary | ICD-10-CM | POA: Insufficient documentation

## 2015-02-04 DIAGNOSIS — E119 Type 2 diabetes mellitus without complications: Secondary | ICD-10-CM | POA: Diagnosis not present

## 2015-02-04 DIAGNOSIS — I10 Essential (primary) hypertension: Secondary | ICD-10-CM | POA: Diagnosis not present

## 2015-02-04 DIAGNOSIS — I2510 Atherosclerotic heart disease of native coronary artery without angina pectoris: Secondary | ICD-10-CM | POA: Diagnosis not present

## 2015-02-04 DIAGNOSIS — F17210 Nicotine dependence, cigarettes, uncomplicated: Secondary | ICD-10-CM | POA: Insufficient documentation

## 2015-02-04 NOTE — ED Notes (Signed)
Pt reports not eating for past 2-3 days with nausea.  Pt had pneumonia one week ago.  Pt alert and oriented.

## 2015-02-04 NOTE — ED Notes (Signed)
Called for pt. Registration stated pt last approx 5 min ago

## 2015-02-04 NOTE — ED Notes (Signed)
C/o nausea, denies any pain or nausea at this time.

## 2015-03-07 ENCOUNTER — Encounter (HOSPITAL_COMMUNITY): Payer: Self-pay | Admitting: *Deleted

## 2015-03-07 ENCOUNTER — Emergency Department (HOSPITAL_COMMUNITY): Payer: Medicare Other

## 2015-03-07 ENCOUNTER — Inpatient Hospital Stay (HOSPITAL_COMMUNITY)
Admission: EM | Admit: 2015-03-07 | Discharge: 2015-03-11 | DRG: 193 | Disposition: A | Discharge status: Hospice | Payer: Medicare Other | Attending: Internal Medicine | Admitting: Internal Medicine

## 2015-03-07 DIAGNOSIS — Z7984 Long term (current) use of oral hypoglycemic drugs: Secondary | ICD-10-CM | POA: Diagnosis not present

## 2015-03-07 DIAGNOSIS — Z823 Family history of stroke: Secondary | ICD-10-CM | POA: Diagnosis not present

## 2015-03-07 DIAGNOSIS — R627 Adult failure to thrive: Secondary | ICD-10-CM

## 2015-03-07 DIAGNOSIS — R531 Weakness: Secondary | ICD-10-CM | POA: Diagnosis not present

## 2015-03-07 DIAGNOSIS — E860 Dehydration: Secondary | ICD-10-CM | POA: Diagnosis present

## 2015-03-07 DIAGNOSIS — D649 Anemia, unspecified: Secondary | ICD-10-CM | POA: Diagnosis present

## 2015-03-07 DIAGNOSIS — Z789 Other specified health status: Secondary | ICD-10-CM | POA: Diagnosis not present

## 2015-03-07 DIAGNOSIS — I2510 Atherosclerotic heart disease of native coronary artery without angina pectoris: Secondary | ICD-10-CM | POA: Diagnosis present

## 2015-03-07 DIAGNOSIS — J9601 Acute respiratory failure with hypoxia: Secondary | ICD-10-CM | POA: Diagnosis present

## 2015-03-07 DIAGNOSIS — Z833 Family history of diabetes mellitus: Secondary | ICD-10-CM | POA: Diagnosis not present

## 2015-03-07 DIAGNOSIS — Z7189 Other specified counseling: Secondary | ICD-10-CM | POA: Diagnosis not present

## 2015-03-07 DIAGNOSIS — I252 Old myocardial infarction: Secondary | ICD-10-CM | POA: Diagnosis not present

## 2015-03-07 DIAGNOSIS — I10 Essential (primary) hypertension: Secondary | ICD-10-CM | POA: Diagnosis present

## 2015-03-07 DIAGNOSIS — Z955 Presence of coronary angioplasty implant and graft: Secondary | ICD-10-CM | POA: Diagnosis not present

## 2015-03-07 DIAGNOSIS — Z6820 Body mass index (BMI) 20.0-20.9, adult: Secondary | ICD-10-CM

## 2015-03-07 DIAGNOSIS — F329 Major depressive disorder, single episode, unspecified: Secondary | ICD-10-CM | POA: Diagnosis present

## 2015-03-07 DIAGNOSIS — Z8249 Family history of ischemic heart disease and other diseases of the circulatory system: Secondary | ICD-10-CM

## 2015-03-07 DIAGNOSIS — F509 Eating disorder, unspecified: Secondary | ICD-10-CM | POA: Diagnosis present

## 2015-03-07 DIAGNOSIS — Z7982 Long term (current) use of aspirin: Secondary | ICD-10-CM

## 2015-03-07 DIAGNOSIS — E43 Unspecified severe protein-calorie malnutrition: Secondary | ICD-10-CM | POA: Diagnosis present

## 2015-03-07 DIAGNOSIS — G3183 Dementia with Lewy bodies: Secondary | ICD-10-CM | POA: Diagnosis present

## 2015-03-07 DIAGNOSIS — E785 Hyperlipidemia, unspecified: Secondary | ICD-10-CM | POA: Diagnosis present

## 2015-03-07 DIAGNOSIS — J189 Pneumonia, unspecified organism: Principal | ICD-10-CM | POA: Diagnosis present

## 2015-03-07 DIAGNOSIS — E119 Type 2 diabetes mellitus without complications: Secondary | ICD-10-CM | POA: Diagnosis present

## 2015-03-07 DIAGNOSIS — Z7902 Long term (current) use of antithrombotics/antiplatelets: Secondary | ICD-10-CM | POA: Diagnosis not present

## 2015-03-07 DIAGNOSIS — Z515 Encounter for palliative care: Secondary | ICD-10-CM | POA: Diagnosis not present

## 2015-03-07 DIAGNOSIS — Z66 Do not resuscitate: Secondary | ICD-10-CM | POA: Diagnosis present

## 2015-03-07 DIAGNOSIS — R32 Unspecified urinary incontinence: Secondary | ICD-10-CM | POA: Diagnosis present

## 2015-03-07 DIAGNOSIS — R0682 Tachypnea, not elsewhere classified: Secondary | ICD-10-CM

## 2015-03-07 DIAGNOSIS — F0280 Dementia in other diseases classified elsewhere without behavioral disturbance: Secondary | ICD-10-CM | POA: Diagnosis present

## 2015-03-07 DIAGNOSIS — Z87891 Personal history of nicotine dependence: Secondary | ICD-10-CM | POA: Diagnosis not present

## 2015-03-07 HISTORY — DX: Unspecified dementia, unspecified severity, without behavioral disturbance, psychotic disturbance, mood disturbance, and anxiety: F03.90

## 2015-03-07 LAB — GLUCOSE, CAPILLARY: Glucose-Capillary: 111 mg/dL — ABNORMAL HIGH (ref 65–99)

## 2015-03-07 LAB — CBC WITH DIFFERENTIAL/PLATELET
BASOS PCT: 0 %
Basophils Absolute: 0 10*3/uL (ref 0.0–0.1)
Eosinophils Absolute: 0.1 10*3/uL (ref 0.0–0.7)
Eosinophils Relative: 1 %
HEMATOCRIT: 26.2 % — AB (ref 36.0–46.0)
HEMOGLOBIN: 8.7 g/dL — AB (ref 12.0–15.0)
LYMPHS ABS: 0.8 10*3/uL (ref 0.7–4.0)
Lymphocytes Relative: 10 %
MCH: 32.1 pg (ref 26.0–34.0)
MCHC: 33.2 g/dL (ref 30.0–36.0)
MCV: 96.7 fL (ref 78.0–100.0)
MONO ABS: 0.5 10*3/uL (ref 0.1–1.0)
MONOS PCT: 5 %
NEUTROS PCT: 84 %
Neutro Abs: 7.2 10*3/uL (ref 1.7–7.7)
Platelets: 296 10*3/uL (ref 150–400)
RBC: 2.71 MIL/uL — ABNORMAL LOW (ref 3.87–5.11)
RDW: 13.2 % (ref 11.5–15.5)
WBC: 8.6 10*3/uL (ref 4.0–10.5)

## 2015-03-07 LAB — COMPREHENSIVE METABOLIC PANEL
ALBUMIN: 2.6 g/dL — AB (ref 3.5–5.0)
ALK PHOS: 95 U/L (ref 38–126)
ALT: 9 U/L — ABNORMAL LOW (ref 14–54)
ANION GAP: 12 (ref 5–15)
AST: 14 U/L — ABNORMAL LOW (ref 15–41)
BILIRUBIN TOTAL: 1.2 mg/dL (ref 0.3–1.2)
BUN: 29 mg/dL — ABNORMAL HIGH (ref 6–20)
CALCIUM: 9.3 mg/dL (ref 8.9–10.3)
CO2: 22 mmol/L (ref 22–32)
Chloride: 99 mmol/L — ABNORMAL LOW (ref 101–111)
Creatinine, Ser: 0.91 mg/dL (ref 0.44–1.00)
GLUCOSE: 107 mg/dL — AB (ref 65–99)
POTASSIUM: 4.6 mmol/L (ref 3.5–5.1)
Sodium: 133 mmol/L — ABNORMAL LOW (ref 135–145)
TOTAL PROTEIN: 7 g/dL (ref 6.5–8.1)

## 2015-03-07 LAB — URINALYSIS, ROUTINE W REFLEX MICROSCOPIC
Glucose, UA: NEGATIVE mg/dL
NITRITE: NEGATIVE
PH: 5 (ref 5.0–8.0)
PROTEIN: NEGATIVE mg/dL
Specific Gravity, Urine: 1.025 (ref 1.005–1.030)

## 2015-03-07 LAB — VITAMIN B12: VITAMIN B 12: 873 pg/mL (ref 180–914)

## 2015-03-07 LAB — URINE MICROSCOPIC-ADD ON

## 2015-03-07 LAB — I-STAT CG4 LACTIC ACID, ED: LACTIC ACID, VENOUS: 1.19 mmol/L (ref 0.5–2.0)

## 2015-03-07 LAB — RETICULOCYTES
RBC.: 2.55 MIL/uL — AB (ref 3.87–5.11)
RETIC COUNT ABSOLUTE: 58.7 10*3/uL (ref 19.0–186.0)
RETIC CT PCT: 2.3 % (ref 0.4–3.1)

## 2015-03-07 LAB — IRON AND TIBC
Iron: 22 ug/dL — ABNORMAL LOW (ref 28–170)
SATURATION RATIOS: 11 % (ref 10.4–31.8)
TIBC: 192 ug/dL — ABNORMAL LOW (ref 250–450)
UIBC: 170 ug/dL

## 2015-03-07 LAB — FOLATE: FOLATE: 2.6 ng/mL — AB (ref >5.9)

## 2015-03-07 LAB — CORTISOL: Cortisol, Plasma: 18.9 ug/dL

## 2015-03-07 LAB — POC OCCULT BLOOD, ED: Fecal Occult Bld: NEGATIVE

## 2015-03-07 LAB — FERRITIN: Ferritin: 713 ng/mL — ABNORMAL HIGH (ref 11–307)

## 2015-03-07 LAB — TSH: TSH: 0.505 u[IU]/mL (ref 0.350–4.500)

## 2015-03-07 LAB — MAGNESIUM: MAGNESIUM: 2 mg/dL (ref 1.7–2.4)

## 2015-03-07 LAB — TROPONIN I

## 2015-03-07 MED ORDER — SODIUM CHLORIDE 0.9 % IJ SOLN
3.0000 mL | Freq: Two times a day (BID) | INTRAMUSCULAR | Status: DC
  Administered 2015-03-07 – 2015-03-08 (×2): 3 mL via INTRAVENOUS

## 2015-03-07 MED ORDER — PRAVASTATIN SODIUM 10 MG PO TABS
10.0000 mg | ORAL_TABLET | Freq: Every day | ORAL | Status: DC
  Administered 2015-03-07: 10 mg via ORAL
  Filled 2015-03-07 (×3): qty 1

## 2015-03-07 MED ORDER — DEXTROSE 5 % IV SOLN
1.0000 g | Freq: Once | INTRAVENOUS | Status: AC
  Administered 2015-03-07: 1 g via INTRAVENOUS
  Filled 2015-03-07: qty 10

## 2015-03-07 MED ORDER — SODIUM CHLORIDE 0.9 % IV SOLN
INTRAVENOUS | Status: DC
Start: 2015-03-07 — End: 2015-03-11
  Administered 2015-03-08 – 2015-03-10 (×2): via INTRAVENOUS

## 2015-03-07 MED ORDER — DEXTROSE 5 % IV SOLN
500.0000 mg | INTRAVENOUS | Status: DC
  Administered 2015-03-07 – 2015-03-10 (×4): 500 mg via INTRAVENOUS
  Filled 2015-03-07 (×8): qty 500

## 2015-03-07 MED ORDER — SODIUM CHLORIDE 0.9 % IV SOLN: 250.0000 mL | INTRAVENOUS | Status: DC | PRN

## 2015-03-07 MED ORDER — ENOXAPARIN SODIUM 40 MG/0.4ML ~~LOC~~ SOLN
40.0000 mg | SUBCUTANEOUS | Status: DC
  Administered 2015-03-07 – 2015-03-10 (×4): 40 mg via SUBCUTANEOUS
  Filled 2015-03-07 (×4): qty 0.4

## 2015-03-07 MED ORDER — SODIUM CHLORIDE 0.9 % IV BOLUS (SEPSIS)
1000.0000 mL | Freq: Once | INTRAVENOUS | Status: AC
  Administered 2015-03-07: 1000 mL via INTRAVENOUS

## 2015-03-07 MED ORDER — SODIUM CHLORIDE 0.9 % IV SOLN: INTRAVENOUS | Status: DC

## 2015-03-07 MED ORDER — LORAZEPAM 2 MG/ML IJ SOLN
1.0000 mg | Freq: Once | INTRAMUSCULAR | Status: AC
  Administered 2015-03-07: 1 mg via INTRAVENOUS
  Filled 2015-03-07: qty 1

## 2015-03-07 MED ORDER — LISINOPRIL 10 MG PO TABS: 40.0000 mg | ORAL_TABLET | Freq: Every day | ORAL | Status: DC

## 2015-03-07 MED ORDER — ASPIRIN EC 81 MG PO TBEC
81.0000 mg | DELAYED_RELEASE_TABLET | Freq: Every day | ORAL | Status: DC
  Administered 2015-03-07: 81 mg via ORAL
  Filled 2015-03-07 (×3): qty 1

## 2015-03-07 MED ORDER — CLOPIDOGREL BISULFATE 75 MG PO TABS
75.0000 mg | ORAL_TABLET | Freq: Every day | ORAL | Status: DC
  Administered 2015-03-07: 75 mg via ORAL
  Filled 2015-03-07 (×3): qty 1

## 2015-03-07 MED ORDER — LORAZEPAM 2 MG/ML IJ SOLN
1.0000 mg | Freq: Three times a day (TID) | INTRAMUSCULAR | Status: DC | PRN
  Administered 2015-03-07 – 2015-03-08 (×3): 1 mg via INTRAVENOUS
  Filled 2015-03-07 (×3): qty 1

## 2015-03-07 MED ORDER — SODIUM CHLORIDE 0.9 % IJ SOLN
3.0000 mL | Freq: Two times a day (BID) | INTRAMUSCULAR | Status: DC
  Administered 2015-03-07: 3 mL via INTRAVENOUS

## 2015-03-07 MED ORDER — SODIUM CHLORIDE 0.9 % IJ SOLN: 3.0000 mL | INTRAMUSCULAR | Status: DC | PRN

## 2015-03-07 MED ORDER — HYDROCHLOROTHIAZIDE 12.5 MG PO CAPS
12.5000 mg | ORAL_CAPSULE | Freq: Every day | ORAL | Status: DC
  Filled 2015-03-07 (×3): qty 1

## 2015-03-07 MED ORDER — METFORMIN HCL 500 MG PO TABS: 500.0000 mg | ORAL_TABLET | Freq: Every day | ORAL | Status: DC

## 2015-03-07 MED ORDER — DEXTROSE 5 % IV SOLN
1.0000 g | INTRAVENOUS | Status: DC
  Administered 2015-03-08 – 2015-03-10 (×3): 1 g via INTRAVENOUS
  Filled 2015-03-07 (×7): qty 10

## 2015-03-07 MED ORDER — POTASSIUM CHLORIDE CRYS ER 20 MEQ PO TBCR: 20.0000 meq | EXTENDED_RELEASE_TABLET | Freq: Every day | ORAL | Status: DC

## 2015-03-07 NOTE — H&P (Signed)
History and Physical  Nichole May:[id number] DOB: Jun 30, 1950 DOA: 03/07/2015  Referring physician: ED physician PCP: [name], MD   Chief Complaint: failure to thrive; fatigue  HPI:  65 year old female brought in by her son and daughter with concerns over increased work of breathing and decreased PO intake.  Patient is an outpatient patient of [name] and has a history of CAD, diabetes (last HgA1c of 5.0), HTN.  Patient does not converse much at present in the ED- she did receive a dose of ativan.  Per patient's son and daughter for the past 6 weeks she has not eaten or drank much.  She was treated about 6 weeks ago outpatient for pneumonia and did take all of her medications.  Per her children she improved from a respiratory standpoint but then declined recently.  Today patients children report that she has had poor PO intake as well as fatigue and that they finally convinced her to come into the hospital.  After reviewing previous note patient has lost 10kg in the past 6 weeks.  Children also voice that patient has been struggling with depression since the death of her husband almost two years ago.  Prior to her husbands death he did mention to her children she was struggling with memory loss and seemed to not be herself.  Her depression was addressed and she was placed on Lexapro which was ultimately stopped due to patient still having fatigue and no real improvement.  Her primary care did address dementia with the family and placed her on Namenda which she took for 30 days but then, due to cost, was discontinued.  She did have a CT scan over the summer but children do not know the result.  Reviewing the chart the CT of the head showed atrophic and chronic white matter ischemic changes without acute abnormality.  Her children voice that it is a struggle for them to get the patient to eat or drink anything.  All she does is eat a majority of the time.  She does no leave her bed for a  majority of the day.  The patient is sleepy and will awake to her name and answer questions but not appropriately.  Family denies fevers, patient complaining of chest pain, nausea, vomiting.    In the emergency department patient was evaluated with CT of the head, underwent laboratory evaluation and EKG. Pertinent labs: below EKG: Independently reviewed. wandering baseline, with a slight elevation in aVF, similar to 2016, on repeat.  Imaging: independently reviewed.  Review of Systems:  Positive as mentioned in HPI Negative (per family as patient did not answer) for fever, visual changes, sore throat, rash, new muscle aches, chest pain, dysuria, bleeding, n/v/abdominal pain.  Past Medical History  Diagnosis Date  . Diabetes mellitus   . Hypertension   . Tobacco abuse   . CAD (coronary artery disease)     10/08/11 Inf STEMI  . Hyperlipidemia   . Memory loss   . Dementia     Past Surgical History  Procedure Laterality Date  . Denies    . Coronary angioplasty with stent placement    . Left heart catheterization with coronary angiogram N/A 10/08/2011    Procedure: LEFT HEART CATHETERIZATION WITH CORONARY ANGIOGRAM;  Surgeon: Kathleene Hazel, MD;  Location: [facility] CATH LAB;  Service: Cardiovascular;  Laterality: N/A;    Social History:  reports that she has quit smoking. Her smoking use included Cigarettes. She has a 7.5 pack-year  smoking history. She has never used smokeless tobacco. She reports that she does not drink alcohol or use illicit drugs. lives alone but son is currently living with her Partial assistance  No Known Allergies  Family History  Problem Relation Age of Onset  . Heart attack Father   . Diabetes Mother   . Stroke Mother   . Stroke Father      Prior to Admission medications   Medication Sig Start Date End Date Taking? Authorizing Provider  acetaminophen (TYLENOL) 500 MG tablet Take 500 mg by mouth every 6 (six) hours as needed for pain.   Yes  Historical Provider, MD  aspirin 81 MG tablet Take 81 mg by mouth daily.   Yes Historical Provider, MD  clopidogrel (PLAVIX) 75 MG tablet Take 1 tablet (75 mg total) by mouth daily. 10/28/14  Yes Merlyn Albert, MD  hydrochlorothiazide (MICROZIDE) 12.5 MG capsule TAKE 1 CAPSULE BY MOUTH ONCE DAILY. 10/28/14  Yes Merlyn Albert, MD  lisinopril (PRINIVIL,ZESTRIL) 40 MG tablet Take 1 tablet (40 mg total) by mouth daily. 10/28/14  Yes Merlyn Albert, MD  lovastatin (MEVACOR) 20 MG tablet Take 1 tablet (20 mg total) by mouth at bedtime. 10/28/14  Yes Merlyn Albert, MD  metFORMIN (GLUCOPHAGE) 500 MG tablet TAKE 1 TABLET BY MOUTH ONCE DAILY WITH BREAKFAST. 10/28/14  Yes Merlyn Albert, MD  nitroGLYCERIN (NITROSTAT) 0.4 MG SL tablet Place 0.4 mg under the tongue every 5 (five) minutes as needed for chest pain.   Yes Historical Provider, MD  potassium chloride SA (KLOR-CON M20) 20 MEQ tablet Take 1 tablet (20 mEq total) by mouth daily. Patient taking differently: Take 20 mEq by mouth at bedtime.  10/28/14  Yes Merlyn Albert, MD  amoxicillin-clavulanate (AUGMENTIN) 875-125 MG tablet Take 1 tablet by mouth 2 (two) times daily. For 10 days Patient not taking: Reported on 03/07/2015 01/20/15   Merlyn Albert, MD  BAYER MICROLET LANCETS lancets USE AS DIRECTED THREE TIMES DAILY. 08/12/13   Merlyn Albert, MD  citalopram (CELEXA) 20 MG tablet Take 1 tablet (20 mg total) by mouth daily. Patient not taking: Reported on 03/07/2015 04/27/14   Merlyn Albert, MD  FREESTYLE TEST STRIPS test strip USE AS DIRECTED. 08/12/13   Merlyn Albert, MD  vitamin B-12 (CYANOCOBALAMIN) 1000 MCG tablet One q am Patient not taking: Reported on 03/07/2015 07/13/14   Merlyn Albert, MD   Physical Exam: Filed Vitals:   03/07/15 1200 03/07/15 1300 03/07/15 1330 03/07/15 1350  BP: 89/62 98/57  88/44  Pulse: 86 69 71 72  Temp:      TempSrc:      Resp:   24 22  Height:      Weight:      SpO2: 96% 95% 97% 96%     General:  Appears calm and comfortable, sleeping Eyes: PERRL, normal lids, irises & conjunctiva ENT: grossly normal hearing, lips & tongue Neck: no LAD, masses or thyromegaly Cardiovascular:  Difficult to assess, S1S2 Respiratory: decreased respiratory effort, poor aeration, difficult exam due to patient uncooperative with exam Abdomen: soft, ntnd Skin: no rash or induration seen on limited exam, significant loose skin seen Musculoskeletal:  Decreased tone BUE/BLE but patient uncooperative with exam Psychiatric: alert and oriented x 1- person only Neurologic: unable to assess  Wt Readings from Last 3 Encounters:  03/07/15 52.617 kg (116 lb)  02/04/15 62.596 kg (138 lb)  01/20/15 62.596 kg (138 lb)    Labs on Admission:  Basic Metabolic Panel:  Recent Labs Lab 03/07/15 1204  NA 133*  K 4.6  CL 99*  CO2 22  GLUCOSE 107*  BUN 29*  CREATININE 0.91  CALCIUM 9.3    Liver Function Tests:  Recent Labs Lab 03/07/15 1204  AST 14*  ALT 9*  ALKPHOS 95  BILITOT 1.2  PROT 7.0  ALBUMIN 2.6*   No results for input(s): LIPASE, AMYLASE in the last 168 hours. No results for input(s): AMMONIA in the last 168 hours.  CBC:  Recent Labs Lab 03/07/15 1204  WBC 8.6  NEUTROABS 7.2  HGB 8.7*  HCT 26.2*  MCV 96.7  PLT 296    Cardiac Enzymes:  Recent Labs Lab 03/07/15 1204  TROPONINI <0.03    Troponin (Point of Care Test) No results for input(s): TROPIPOC in the last 72 hours.  BNP (last 3 results) No results for input(s): PROBNP in the last 8760 hours.  CBG: No results for input(s): GLUCAP in the last 168 hours.   Radiological Exams on Admission: Dg Chest 2 View  03/07/2015  CLINICAL DATA:  Altered mental status.  Cough and loss of appetite. EXAM: CHEST  2 VIEW COMPARISON:  10/08/2011 FINDINGS: Cardiomediastinal silhouette is normal. Mediastinal contours appear intact. There is no evidence of pleural effusion or pneumothorax. There is a patchy airspace  consolidation in the anterior segment of the right upper lobe, likely representing pneumonia. Osseous structures are without acute abnormality. Soft tissues are grossly normal. IMPRESSION: Right upper lobe pneumonia. Electronically Signed   By: Ted Mcalpine M.D.   On: 03/07/2015 12:53   Ct Head Wo Contrast  03/07/2015  CLINICAL DATA:  Altered mental status, no known injury. EXAM: CT HEAD WITHOUT CONTRAST TECHNIQUE: Contiguous axial images were obtained from the base of the skull through the vertex without intravenous contrast. COMPARISON:  07/07/2014 FINDINGS: There is no evidence of mass effect, midline shift, or extra-axial fluid collections. There is no evidence of a space-occupying lesion or intracranial hemorrhage. There is no evidence of a cortical-based area of acute infarction. There is generalized cerebral atrophy. There is severe periventricular white matter low attenuation likely secondary to microangiopathy. The ventricles and sulci are appropriate for the patient's age. The basal cisterns are patent. Visualized portions of the orbits are unremarkable. The visualized portions of the paranasal sinuses and mastoid air cells are unremarkable. Cerebrovascular atherosclerotic calcifications are noted. The osseous structures are unremarkable. IMPRESSION: No acute intracranial pathology. Electronically Signed   By: Elige Ko   On: 03/07/2015 13:37      Active Problems:   CAP (community acquired pneumonia)   Assessment/Plan 1. CAP: treat with Zithromax  given in ED as well as 1g Ceftriaxone, monitor vitals closely, oxygen therapy as needed 2. Dementia: this is a significant component of patient's ability to improve as well as her safety to go home and be able to perform all ADL's.  Discussed with patients children the importance of starting to think of end of life issues.  Palliative care consult placed.  Patient does not seem like she would be able to care for herself but may  improve after fluid resuscitation.  Patients children live about 40 minutes away but would like to be called with updates.  Her son Maisie Fus [phone] and Wilkie Aye, her daughter [phone] would like to be called after plan is made and with updates. Per the history, patient has been declining more rapidly in the past 6 weeks but the decline started with the death of her husband almost  two years ago.  Will check TSH, RPR for thoroughness.  UA does have Leukocytes but it was not a clean catch. 3. Dehydration: specific gravity seen on UA of 1.025.  Will continue hydration with NS at 125/hr.  Monitor for signs of fluid overload 4. Anemia: likely due to malnutrition but will order anemia panel, FOBT negative, no signs of acute blood loss, MCV WNL  5. Weight loss/ FTT: will order soft food diet as children state patient eats mostly soft foods, patients children state she would not want a feeding tube were it to come down to it   Code Status: DNR  DVT prophylaxis:lovenox Family Communication: discussed with patient's children Maisie Fus and [place] as well as her sister Disposition Plan/Anticipated LOS: anticipate LOS to be 3-4 days pending placement and clinical improvement  Time spent: 50 minutes  Reuben Likes, MD  [facility]  03/07/2015, 3:58 PM

## 2015-03-07 NOTE — ED Notes (Signed)
Pt incontinent of urine. Depends removed and new one placed.

## 2015-03-07 NOTE — ED Notes (Signed)
Family states pt has generalized weakness. Not eating and drinking well. Dx with pneumonia in December and has gotten worse snice. PCP stated there was nothing they could do nothing else and to bring her to the ED. States pt is sleeping a lot. Pt also has dementia.

## 2015-03-07 NOTE — ED Provider Notes (Signed)
CSN: [id number]     Arrival date & time 03/07/15  1116 History  By signing my name below, I, Marica Otter, attest that this documentation has been prepared under the direction and in the presence of Standley Brooking, MD. Electronically Signed: Marica Otter, ED Scribe. 03/07/2015. 12:22 PM.   LEVEL 5 CAVEAT: DEMENTIA   Chief Complaint  Patient presents with  . Fatigue   HPI PCP: [name], MD HPI Comments: Nichole May is a 65 y.o. female, with PMHx noted below including dementia, accompanied by her daughter and sister, who presents to the Emergency Department with multiple complaints all onset following being ill with pneumonia this past December. Daughter complains of: (1) worsening generalized weakness onset three weeks ago; (2) decreased intake PO onset three weeks ago; (3)  increased fatigue onset three weeks ago; (4) weight loss; (5) intermittent cough. Further, pt complains of mild headache onset today. Pt denies diarrhea, recent falls, n/v, appetite change, abd pain or any other pain at this time. Daughter denies any fevers at home or recent falls.    Past Medical History  Diagnosis Date  . Diabetes mellitus   . Hypertension   . Tobacco abuse   . CAD (coronary artery disease)     10/08/11 Inf STEMI  . Hyperlipidemia   . Memory loss   . Dementia    Past Surgical History  Procedure Laterality Date  . Denies    . Coronary angioplasty with stent placement    . Left heart catheterization with coronary angiogram N/A 10/08/2011    Procedure: LEFT HEART CATHETERIZATION WITH CORONARY ANGIOGRAM;  Surgeon: Kathleene Hazel, MD;  Location: [facility] CATH LAB;  Service: Cardiovascular;  Laterality: N/A;   Family History  Problem Relation Age of Onset  . Heart attack Father   . Diabetes Mother   . Stroke Mother   . Stroke Father    Social History  Substance Use Topics  . Smoking status: Former Smoker -- 0.50 packs/day for 15 years    Types: Cigarettes  . Smokeless tobacco:  Never Used  . Alcohol Use: No   OB History    No data available     Review of Systems  A complete 10 system review of systems was obtained and all systems are negative except as noted in the HPI and PMH.   Allergies  Review of patient's allergies indicates no known allergies.  Home Medications   Prior to Admission medications   Medication Sig Start Date End Date Taking? Authorizing Provider  acetaminophen (TYLENOL) 500 MG tablet Take 500 mg by mouth every 6 (six) hours as needed for pain.   Yes Historical Provider, MD  aspirin 81 MG tablet Take 81 mg by mouth daily.   Yes Historical Provider, MD  clopidogrel (PLAVIX) 75 MG tablet Take 1 tablet (75 mg total) by mouth daily. 10/28/14  Yes Merlyn Albert, MD  hydrochlorothiazide (MICROZIDE) 12.5 MG capsule TAKE 1 CAPSULE BY MOUTH ONCE DAILY. 10/28/14  Yes Merlyn Albert, MD  lisinopril (PRINIVIL,ZESTRIL) 40 MG tablet Take 1 tablet (40 mg total) by mouth daily. 10/28/14  Yes Merlyn Albert, MD  lovastatin (MEVACOR) 20 MG tablet Take 1 tablet (20 mg total) by mouth at bedtime. 10/28/14  Yes Merlyn Albert, MD  metFORMIN (GLUCOPHAGE) 500 MG tablet TAKE 1 TABLET BY MOUTH ONCE DAILY WITH BREAKFAST. 10/28/14  Yes Merlyn Albert, MD  nitroGLYCERIN (NITROSTAT) 0.4 MG SL tablet Place 0.4 mg under the tongue every 5 (five) minutes  as needed for chest pain.   Yes Historical Provider, MD  potassium chloride SA (KLOR-CON M20) 20 MEQ tablet Take 1 tablet (20 mEq total) by mouth daily. Patient taking differently: Take 20 mEq by mouth at bedtime.  10/28/14  Yes Merlyn Albert, MD  amoxicillin-clavulanate (AUGMENTIN) 875-125 MG tablet Take 1 tablet by mouth 2 (two) times daily. For 10 days Patient not taking: Reported on 03/07/2015 01/20/15   Merlyn Albert, MD  BAYER MICROLET LANCETS lancets USE AS DIRECTED THREE TIMES DAILY. 08/12/13   Merlyn Albert, MD  citalopram (CELEXA) 20 MG tablet Take 1 tablet (20 mg total) by mouth daily. Patient not  taking: Reported on 03/07/2015 04/27/14   Merlyn Albert, MD  FREESTYLE TEST STRIPS test strip USE AS DIRECTED. 08/12/13   Merlyn Albert, MD  vitamin B-12 (CYANOCOBALAMIN) 1000 MCG tablet One q am Patient not taking: Reported on 03/07/2015 07/13/14   Merlyn Albert, MD   Triage Vitals: BP 91/53 mmHg  Pulse 93  Temp(Src) 97.5 F (36.4 C) (Oral)  Resp 16  Ht  (1.676 m)  Wt 116 lb (52.617 kg)  BMI 18.73 kg/m2  SpO2 94% Physical Exam  Constitutional: She appears well-developed and well-nourished. No distress.  HENT:  Head: Normocephalic and atraumatic.  Mouth/Throat: Oropharynx is clear and moist. Mucous membranes are dry. No oropharyngeal exudate.  Eyes: Conjunctivae and EOM are normal. Pupils are equal, round, and reactive to light.  Neck: Normal range of motion. Neck supple.  No meningismus.  Cardiovascular: Normal rate, regular rhythm, normal heart sounds and intact distal pulses.   No murmur heard. Pulmonary/Chest: Effort normal and breath sounds normal. No respiratory distress.  Abdominal: Soft. There is no tenderness. There is no rebound and no guarding.  Loose skin  Musculoskeletal: Normal range of motion. She exhibits no edema or tenderness.  Neurological: She is alert. No cranial nerve deficit. She exhibits normal muscle tone. Coordination normal.  Oriented x2. Combatative; follows some verbal commands. Moving all extremities spontaneously.   Skin: Skin is warm.  Nursing note and vitals reviewed.   ED Course  Procedures (including critical care time) DIAGNOSTIC STUDIES: Oxygen Saturation is 94% on ra, adequate by my interpretation.    COORDINATION OF CARE: 12:09 PM: Discussed treatment plan which includes imaging, labs, IV fluids, meds, and circumstances that may warrant hospital admission with pt's family at bedside; family verbalizes understanding and agrees with treatment plan.  Labs Review Labs Reviewed  CBC WITH DIFFERENTIAL/PLATELET - Abnormal; Notable  for the following:    RBC 2.71 (*)    Hemoglobin 8.7 (*)    HCT 26.2 (*)    All other components within normal limits  COMPREHENSIVE METABOLIC PANEL - Abnormal; Notable for the following:    Sodium 133 (*)    Chloride 99 (*)    Glucose, Bld 107 (*)    BUN 29 (*)    Albumin 2.6 (*)    AST 14 (*)    ALT 9 (*)    All other components within normal limits  URINALYSIS, ROUTINE W REFLEX MICROSCOPIC (NOT AT [facility]) - Abnormal; Notable for the following:    Hgb urine dipstick TRACE (*)    Bilirubin Urine SMALL (*)    Ketones, ur TRACE (*)    Leukocytes, UA TRACE (*)    All other components within normal limits  URINE MICROSCOPIC-ADD ON - Abnormal; Notable for the following:    Squamous Epithelial / LPF 6-30 (*)    Bacteria, UA  MANY (*)    All other components within normal limits  FOLATE - Abnormal; Notable for the following:    Folate 2.6 (*)    All other components within normal limits  IRON AND TIBC - Abnormal; Notable for the following:    Iron 22 (*)    TIBC 192 (*)    All other components within normal limits  FERRITIN - Abnormal; Notable for the following:    Ferritin 713 (*)    All other components within normal limits  RETICULOCYTES - Abnormal; Notable for the following:    RBC. 2.55 (*)    All other components within normal limits  GLUCOSE, CAPILLARY - Abnormal; Notable for the following:    Glucose-Capillary 111 (*)    All other components within normal limits  CULTURE, BLOOD (ROUTINE X 2)  CULTURE, BLOOD (ROUTINE X 2)  TROPONIN I  VITAMIN B12  TSH  MAGNESIUM  CORTISOL  RPR  BASIC METABOLIC PANEL  CBC  POC OCCULT BLOOD, ED  I-STAT CG4 LACTIC ACID, ED    Imaging Review Dg Chest 2 View  03/07/2015  CLINICAL DATA:  Altered mental status.  Cough and loss of appetite. EXAM: CHEST  2 VIEW COMPARISON:  10/08/2011 FINDINGS: Cardiomediastinal silhouette is normal. Mediastinal contours appear intact. There is no evidence of pleural effusion or pneumothorax. There is a  patchy airspace consolidation in the anterior segment of the right upper lobe, likely representing pneumonia. Osseous structures are without acute abnormality. Soft tissues are grossly normal. IMPRESSION: Right upper lobe pneumonia. Electronically Signed   By: Ted Mcalpine M.D.   On: 03/07/2015 12:53   Ct Head Wo Contrast  03/07/2015  CLINICAL DATA:  Altered mental status, no known injury. EXAM: CT HEAD WITHOUT CONTRAST TECHNIQUE: Contiguous axial images were obtained from the base of the skull through the vertex without intravenous contrast. COMPARISON:  07/07/2014 FINDINGS: There is no evidence of mass effect, midline shift, or extra-axial fluid collections. There is no evidence of a space-occupying lesion or intracranial hemorrhage. There is no evidence of a cortical-based area of acute infarction. There is generalized cerebral atrophy. There is severe periventricular white matter low attenuation likely secondary to microangiopathy. The ventricles and sulci are appropriate for the patient's age. The basal cisterns are patent. Visualized portions of the orbits are unremarkable. The visualized portions of the paranasal sinuses and mastoid air cells are unremarkable. Cerebrovascular atherosclerotic calcifications are noted. The osseous structures are unremarkable. IMPRESSION: No acute intracranial pathology. Electronically Signed   By: Elige Ko   On: 03/07/2015 13:37   I have personally reviewed and evaluated these images and lab results as part of my medical decision-making.   EKG Interpretation   Date/Time:  Monday March 07 2015 12:35:10 EST Ventricular Rate:  77 PR Interval:  179 QRS Duration: 118 QT Interval:  390 QTC Calculation: 441 R Axis:   69 Text Interpretation:  Sinus rhythm Nonspecific intraventricular conduction  delay Low voltage, extremity leads Nonspecific T abnormalities, lateral  leads Baseline wander in lead(s) V3 similar to Aug 2016 Confirmed by  Manus Gunning  MD,  Allyanna Appleman [phone]) on 03/07/2015 1:04:32 PM      MDM   Final diagnoses:  CAP (community acquired pneumonia)  Dehydration  Failure to thrive in adult   Dementia patient from home with failure to thrive, not eating and not drinking. Treated for pneumonia in December. Family does not want nursing home placement.  Patient is disoriented. Initial EKG with wandering baseline, repeat shows slight elevation in aVF  which is similar to 2016. Discussed with Dr. Diona Browner who agrees does not meet STEMI criteria.  Hemoglobin 8 from baseline in 12 range.  FOBT negative but poor sample.  X-ray shows right upper lobe pneumonia. This appears to be different than the pneumonia and she had December though no x-ray was obtained at that time.  BP soft in 90s. HR normal, lactate normal. Does not appear to be in shock. IVF, IV antibiotics.  Family agreeable to admission.  They understand that her dementia is likely worsening and she has some depression as well.  D/w Dr. Irene Limbo.   I personally performed the services described in this documentation, which was scribed in my presence. The recorded information has been reviewed and is accurate.   Glynn Octave, MD 03/07/15 [phone]

## 2015-03-07 NOTE — Progress Notes (Signed)
ANTIBIOTIC CONSULT NOTE - INITIAL  Pharmacy Consult for Renal Adjustment Antibiotics (Azithromycin/Ceftriaxone) Indication: pneumonia  No Known Allergies  Patient Measurements: Height:  (167.6 cm) Weight: 123 lb 14.4 oz (56.2 kg) IBW/kg (Calculated) : 59.3 Adjusted Body Weight:   Vital Signs: Temp: 98.7 F (37.1 C) (01/23 1640) Temp Source: Oral (01/23 1640) BP: 100/42 mmHg (01/23 1640) Pulse Rate: 79 (01/23 1640) Intake/Output from previous day:   Intake/Output from this shift: Total I/O In: -  Out: 13 [Urine:13]  Labs:  Recent Labs  03/07/15 1204  WBC 8.6  HGB 8.7*  PLT 296  CREATININE 0.91   Estimated Creatinine Clearance: 54.7 mL/min (by C-G formula based on Cr of 0.91). No results for input(s): VANCOTROUGH, VANCOPEAK, VANCORANDOM, GENTTROUGH, GENTPEAK, GENTRANDOM, TOBRATROUGH, TOBRAPEAK, TOBRARND, AMIKACINPEAK, AMIKACINTROU, AMIKACIN in the last 72 hours.   Microbiology: No results found for this or any previous visit (from the past 720 hour(s)).  Medical History: Past Medical History  Diagnosis Date  . Diabetes mellitus   . Hypertension   . Tobacco abuse   . CAD (coronary artery disease)     10/08/11 Inf STEMI  . Hyperlipidemia   . Memory loss   . Dementia     Medications:  Scheduled:  . sodium chloride   Intravenous STAT  . aspirin EC  81 mg Oral Daily  . azithromycin  500 mg Intravenous Q24H  . [START ON 03/08/2015] cefTRIAXone (ROCEPHIN)  IV  1 g Intravenous Q24H  . clopidogrel  75 mg Oral Daily  . enoxaparin (LOVENOX) injection  40 mg Subcutaneous Q24H  . hydrochlorothiazide  12.5 mg Oral Daily  . lisinopril  40 mg Oral Daily  . [START ON 03/08/2015] metFORMIN  500 mg Oral Q breakfast  . potassium chloride SA  20 mEq Oral QHS  . pravastatin  10 mg Oral q1800  . sodium chloride  3 mL Intravenous Q12H  . sodium chloride  3 mL Intravenous Q12H   Assessment: CAP: treat with Zithromax and Ceftriaxone Azithromycin 500 mg IV  and  Ceftriaxone 1 GM IV given in ED No renal adjustment necessary   Goal of Therapy:  Eradicate infection  Plan:  Continue Azithromycin 500 mg IV every 24 hours Continue Ceftriaxone 1 GM IV every 24 hours No renal adjustment necessary F/U if therapy changes for renal adjustments Nichole May, Nichole May 03/07/2015,4:47 PM

## 2015-03-08 ENCOUNTER — Inpatient Hospital Stay (HOSPITAL_COMMUNITY): Payer: Medicare Other

## 2015-03-08 ENCOUNTER — Encounter (HOSPITAL_COMMUNITY): Payer: Self-pay | Admitting: Primary Care

## 2015-03-08 DIAGNOSIS — Z789 Other specified health status: Secondary | ICD-10-CM

## 2015-03-08 DIAGNOSIS — Z515 Encounter for palliative care: Secondary | ICD-10-CM | POA: Insufficient documentation

## 2015-03-08 DIAGNOSIS — Z7189 Other specified counseling: Secondary | ICD-10-CM | POA: Insufficient documentation

## 2015-03-08 DIAGNOSIS — R627 Adult failure to thrive: Secondary | ICD-10-CM

## 2015-03-08 DIAGNOSIS — E860 Dehydration: Secondary | ICD-10-CM

## 2015-03-08 DIAGNOSIS — J189 Pneumonia, unspecified organism: Principal | ICD-10-CM

## 2015-03-08 LAB — BASIC METABOLIC PANEL
Anion gap: 9 (ref 5–15)
BUN: 15 mg/dL (ref 6–20)
CHLORIDE: 107 mmol/L (ref 101–111)
CO2: 22 mmol/L (ref 22–32)
CREATININE: 0.78 mg/dL (ref 0.44–1.00)
Calcium: 8.7 mg/dL — ABNORMAL LOW (ref 8.9–10.3)
GFR calc Af Amer: >60 mL/min (ref >60)
GFR calc non Af Amer: >60 mL/min (ref >60)
Glucose, Bld: 103 mg/dL — ABNORMAL HIGH (ref 65–99)
Potassium: 4.6 mmol/L (ref 3.5–5.1)
SODIUM: 138 mmol/L (ref 135–145)

## 2015-03-08 LAB — CBC
HCT: 25 % — ABNORMAL LOW (ref 36.0–46.0)
Hemoglobin: 8.2 g/dL — ABNORMAL LOW (ref 12.0–15.0)
MCH: 31.7 pg (ref 26.0–34.0)
MCHC: 32.8 g/dL (ref 30.0–36.0)
MCV: 96.5 fL (ref 78.0–100.0)
PLATELETS: 256 10*3/uL (ref 150–400)
RBC: 2.59 MIL/uL — ABNORMAL LOW (ref 3.87–5.11)
RDW: 13.7 % (ref 11.5–15.5)
WBC: 7.3 10*3/uL (ref 4.0–10.5)

## 2015-03-08 LAB — GLUCOSE, CAPILLARY
Glucose-Capillary: 94 mg/dL (ref 65–99)
Glucose-Capillary: 78 mg/dL (ref 65–99)
Glucose-Capillary: 94 mg/dL (ref 65–99)
Glucose-Capillary: 99 mg/dL (ref 65–99)

## 2015-03-08 LAB — RPR: RPR Ser Ql: NONREACTIVE

## 2015-03-08 MED ORDER — FLUOXETINE HCL 10 MG PO CAPS
10.0000 mg | ORAL_CAPSULE | Freq: Every day | ORAL | Status: DC
  Administered 2015-03-08: 10 mg via ORAL
  Filled 2015-03-08 (×2): qty 1

## 2015-03-08 MED ORDER — LEVALBUTEROL HCL 0.63 MG/3ML IN NEBU
0.6300 mg | INHALATION_SOLUTION | Freq: Four times a day (QID) | RESPIRATORY_TRACT | Status: DC | PRN
  Administered 2015-03-09 (×2): 0.63 mg via RESPIRATORY_TRACT
  Filled 2015-03-08 (×2): qty 3

## 2015-03-08 MED ORDER — CITALOPRAM HYDROBROMIDE 20 MG PO TABS
20.0000 mg | ORAL_TABLET | Freq: Every day | ORAL | Status: DC
  Filled 2015-03-08 (×3): qty 1

## 2015-03-08 MED ORDER — SERTRALINE HCL 20 MG/ML PO CONC: 25.0000 mg | Freq: Every day | ORAL | Status: DC

## 2015-03-08 MED ORDER — LORAZEPAM 2 MG/ML IJ SOLN
0.5000 mg | Freq: Three times a day (TID) | INTRAMUSCULAR | Status: DC | PRN
  Administered 2015-03-10 – 2015-03-11 (×2): 0.5 mg via INTRAVENOUS
  Filled 2015-03-08 (×2): qty 1

## 2015-03-08 NOTE — Progress Notes (Signed)
[facility] PROGRESS NOTE  LATERICA MATARAZZO ZOX:[id number] DOB: Jun 28, 1950 DOA: 03/07/2015 PCP: [name], MD  Assessment/Plan: 1. Dementia: Palliative care consult, Dr. Irene Limbo and I did discuss with patient's family progression of dementia as well as end of life care.  Discussed placement in a SNF as patient is no longer able to care for herself at home and her family can not provide the intense amount of care she requires.  Will await consult recommendations.  Also asked case management to see patient for assistance in placement if that is what the family decides 2. Pneumonia (CAP): can continue Azithromycin  IV and Ceftriaxone 1g IV both q24h.  Patient is requiring oxygen at  3. Anemia: H/H slightly decreased today- likely dilutional from fluid resuscitation.  No signs of active bleeding.  Vitals WNL.  Will continue to monitor 4. Debility: Does not seem able to participate in PT/OT as patient cannot follow instructions.  She would be completely dependent for ADL's were she to let anyone assist her  Code Status: DNR Family Communication: No family at bedside will need to check in with them later today Disposition Plan: ? Of placement- SNF, hospice.  Will follow up after palliative care consultation   Consultants:  Palliative care  Procedures:  none  Antibiotics:  Ceftriaxone 1g q24 started 03/07/15  Azithromycin  IV q24 started 03/07/15  HPI/Subjective: Patient asleep but arousable for exam.  She quickly falls back asleep after awaking.  She is responsive to her name and will occasionally answer yes and no questions.  She said yes when asking if she ate breakfast but nursing staff that is in room states patient did not eat anything.  Unable to elicit any further information from patient as she did not answer any other questions.  Objective: Filed Vitals:   03/07/15 2044 03/08/15 0441  BP: 84/52 106/48  Pulse: 80 81  Temp: 98.3 F (36.8 C) 98.6 F (37 C)  Resp:  20 20    Intake/Output Summary (Last 24 hours) at 03/08/15 0846 Last data filed at 03/07/15 1216  Gross per 24 hour  Intake      0 ml  Output     13 ml  Net    -13 ml   Filed Weights   03/07/15 1122 03/07/15 1640 03/07/15 1753  Weight: 52.617 kg (116 lb) 56.2 kg (123 lb 14.4 oz) 56.2 kg (123 lb 14.4 oz)    Exam:   General:  Asleep, no distress, appears older than stated age female  Cardiovascular: S1S2, RRR, 2+ peripheral pulses bilaterally UE and LE  Respiratory: poor respiratory effort, few crackles in right middle lung field but unable to listen to posterior lung fields as patient is not cooperative  Abdomen: soft, nontender, nondistended, bowel sounds in all 4 quadrants  Musculoskeletal: mittens over patients hands bilaterally as she was pulling at her nasal cannula   Data Reviewed: Basic Metabolic Panel:  Recent Labs Lab 03/07/15 1204 03/07/15 1651 03/08/15 0656  NA 133*  --  138  K 4.6  --  4.6  CL 99*  --  107  CO2 22  --  22  GLUCOSE 107*  --  103*  BUN 29*  --  15  CREATININE 0.91  --  0.78  CALCIUM 9.3  --  8.7*  MG  --  2.0  --    Liver Function Tests:  Recent Labs Lab 03/07/15 1204  AST 14*  ALT 9*  ALKPHOS 95  BILITOT 1.2  PROT 7.0  ALBUMIN 2.6*   No results for input(s): LIPASE, AMYLASE in the last 168 hours. No results for input(s): AMMONIA in the last 168 hours. CBC:  Recent Labs Lab 03/07/15 1204 03/08/15 0656  WBC 8.6 7.3  NEUTROABS 7.2  --   HGB 8.7* 8.2*  HCT 26.2* 25.0*  MCV 96.7 96.5  PLT 296 256   Cardiac Enzymes:  Recent Labs Lab 03/07/15 1204  TROPONINI <0.03   BNP (last 3 results) No results for input(s): BNP in the last 8760 hours.  ProBNP (last 3 results) No results for input(s): PROBNP in the last 8760 hours.  CBG:  Recent Labs Lab 03/07/15 2029 03/08/15 0740  GLUCAP 111* 94    No results found for this or any previous visit (from the past 240 hour(s)).   Studies: Dg Chest 2  View  03/07/2015  CLINICAL DATA:  Altered mental status.  Cough and loss of appetite. EXAM: CHEST  2 VIEW COMPARISON:  10/08/2011 FINDINGS: Cardiomediastinal silhouette is normal. Mediastinal contours appear intact. There is no evidence of pleural effusion or pneumothorax. There is a patchy airspace consolidation in the anterior segment of the right upper lobe, likely representing pneumonia. Osseous structures are without acute abnormality. Soft tissues are grossly normal. IMPRESSION: Right upper lobe pneumonia. Electronically Signed   By: Ted Mcalpine M.D.   On: 03/07/2015 12:53   Ct Head Wo Contrast  03/07/2015  CLINICAL DATA:  Altered mental status, no known injury. EXAM: CT HEAD WITHOUT CONTRAST TECHNIQUE: Contiguous axial images were obtained from the base of the skull through the vertex without intravenous contrast. COMPARISON:  07/07/2014 FINDINGS: There is no evidence of mass effect, midline shift, or extra-axial fluid collections. There is no evidence of a space-occupying lesion or intracranial hemorrhage. There is no evidence of a cortical-based area of acute infarction. There is generalized cerebral atrophy. There is severe periventricular white matter low attenuation likely secondary to microangiopathy. The ventricles and sulci are appropriate for the patient's age. The basal cisterns are patent. Visualized portions of the orbits are unremarkable. The visualized portions of the paranasal sinuses and mastoid air cells are unremarkable. Cerebrovascular atherosclerotic calcifications are noted. The osseous structures are unremarkable. IMPRESSION: No acute intracranial pathology. Electronically Signed   By: Elige Ko   On: 03/07/2015 13:37    Scheduled Meds: . aspirin EC  81 mg Oral Daily  . azithromycin  500 mg Intravenous Q24H  . cefTRIAXone (ROCEPHIN)  IV  1 g Intravenous Q24H  . clopidogrel  75 mg Oral Daily  . enoxaparin (LOVENOX) injection  40 mg Subcutaneous Q24H  . FLUoxetine   10 mg Oral Daily  . potassium chloride SA  20 mEq Oral QHS  . pravastatin  10 mg Oral q1800  . sodium chloride  3 mL Intravenous Q12H  . sodium chloride  3 mL Intravenous Q12H   Continuous Infusions: . sodium chloride 150 mL/hr at 03/08/15 0234    Active Problems:   CAP (community acquired pneumonia)   Failure to thrive in adult    Time spent: 30 minutes    Reuben Likes  Resident Physician [facility] 03/08/2015, 8:46 AM  LOS: 1 day

## 2015-03-08 NOTE — Progress Notes (Signed)
PT Cancellation Note  Patient Details Name: Nichole May MRN: [id number] DOB: [birth year]   Cancelled Treatment:    Reason Eval/Treat Not Completed: Patient's level of consciousness;Fatigue/lethargy limiting ability to participate. Chart reviewed, RN consulted, treatment attempted. Pt is found to be somnolent, mildly aggitated, not following commands, and continually doffing [place], in spite of SaO2 desaturation and safety mittens. Guests in room confirm this is not only patient's baseline level but that she is fairly less agitated than typical, but remains somnolent most of time. Unable to perform OOB at this time: if patient is unable to participate in therapy and follow simple commands consitently, she will not be appropriate for skilled PT intervention. I acknowledge that pt's careplan includes trial of new pharm interventions to address this cognitive state and will continue to monitor. Will attempt PT evaluation at later date/time as medically appropriate.    1:39 PM, 03/08/2015 Rosamaria Lints, PT, DPT PRN Physical Therapist at [facility] [place] License # [id number] [phone] (wireless)  [phone] (mobile)

## 2015-03-08 NOTE — Consult Note (Signed)
Consultation Note Date: 03/08/2015   Patient Name: Nichole May  DOB: [birth year]  MRN: [id number]  Age / Sex: 65 y.o., female  PCP: Merlyn Albert, MD Referring Physician: Standley Brooking, MD  Reason for Consultation: Establishing goals of care and Psychosocial/spiritual support    Clinical Assessment/Narrative: Nichole May is resting quietly in bed.  She will answer me with yes or no, but no detailed answers.  She does not open her eyes. Her daughter, [name], is at bedside.  We talk aobut her chronic illness trajectory with dementia (including mobility and nutrition issues).  [name] talks about the recent declines. 2 years ago after husband died, "worse over the last six months", and then the decline after Thanksgiving, with weight loss of 30+ lbs since December first (per [place]).  [name] talks about Nichole May being paranoid, and unable to participate in conversations, more aggressive and angry, and now incontinent.  [name] talks about recent bout with PNE, and returning to ED for unresolved PNE.  We discuss risks with immobility including PNE.  We discuss the declines that occur with dementia when a person experiences illness or injury.  We talk about 24-48 hours to see if she is able to eat or drink, or if she improves with her PNE.  We talk about SNf for rehab vs in patient Hospice.  We plan to meet again 1/25 at 2 pm.   Contacts/Participants in Discussion: Daughter [name].  Mrs. Buczek was sleeping through most of our conversation.  Primary Decision Maker: Daughter [name] and son Maisie Fus "Gaynelle Adu" Eilers. Another son, Adela Glimpse, lives with Mrs. Shambley, but does not participate in decision making.    Relationship to Patient children HCPOA: no   SUMMARY OF RECOMMENDATIONS  Code Status/Advance Care Planning: DNR We discuss the concepts of Hospice, and Do not treat the next infection.      Code Status  Orders        Start     Ordered   03/07/15 1641  Do not attempt resuscitation (DNR)   Continuous    Question Answer Comment  In the event of cardiac or respiratory ARREST Do not call a "code blue"   In the event of cardiac or respiratory ARREST Do not perform Intubation, CPR, defibrillation or ACLS   In the event of cardiac or respiratory ARREST Use medication by any route, position, wound care, and other measures to relive pain and suffering. May use oxygen, suction and manual treatment of airway obstruction as needed for comfort.      03/07/15 1641    Code Status History    Date Active Date Inactive Code Status Order ID Comments User Context   This patient has a current code status but no historical code status.      Other Directives:None  Symptom Management:   Per Hospitalist  Palliative Prophylaxis:   Aspiration, Frequent Pain Assessment and Turn Reposition  Additional Recommendations (Limitations, Scope, Preferences):  Treat the treatable at this point. Considering hospice based on oputcomes.   Psycho-social/Spiritual:  Support System: Strong Desire for further Chaplaincy support:Will request for daughter and patient if she is able to participate.  Additional Recommendations: Education on Hospice  Prognosis: Unable to determine, based on outcomes.   Discharge Planning: Based on outcomes. Considering Hospice vs SNF if no improvement   Chief Complaint/ Primary Diagnoses: Present on Admission:  . CAP (community acquired pneumonia) . Failure to thrive in adult  I have reviewed the medical record, interviewed the patient and family,  and examined the patient. The following aspects are pertinent.  Past Medical History  Diagnosis Date  . Diabetes mellitus   . Hypertension   . Tobacco abuse   . CAD (coronary artery disease)     10/08/11 Inf STEMI  . Hyperlipidemia   . Memory loss   . Dementia    Social History   Social History  . Marital Status: Married     Spouse Name: N/A  . Number of Children: N/A  . Years of Education: N/A   Social History Main Topics  . Smoking status: Former Smoker -- 0.50 packs/day for 15 years    Types: Cigarettes  . Smokeless tobacco: Never Used  . Alcohol Use: No  . Drug Use: No  . Sexual Activity: No   Other Topics Concern  . None   Social History Narrative   Family History  Problem Relation Age of Onset  . Heart attack Father   . Diabetes Mother   . Stroke Mother   . Stroke Father    Scheduled Meds: . aspirin EC  81 mg Oral Daily  . azithromycin  500 mg Intravenous Q24H  . cefTRIAXone (ROCEPHIN)  IV  1 g Intravenous Q24H  . clopidogrel  75 mg Oral Daily  . enoxaparin (LOVENOX) injection  40 mg Subcutaneous Q24H  . FLUoxetine  10 mg Oral Daily  . potassium chloride SA  20 mEq Oral QHS  . pravastatin  10 mg Oral q1800   Continuous Infusions: . sodium chloride 150 mL/hr at 03/08/15 0234   PRN Meds:.LORazepam Medications Prior to Admission:  Prior to Admission medications   Medication Sig Start Date End Date Taking? Authorizing Provider  acetaminophen (TYLENOL) 500 MG tablet Take 500 mg by mouth every 6 (six) hours as needed for pain.   Yes Historical Provider, MD  aspirin 81 MG tablet Take 81 mg by mouth daily.   Yes Historical Provider, MD  clopidogrel (PLAVIX) 75 MG tablet Take 1 tablet (75 mg total) by mouth daily. 10/28/14  Yes Merlyn Albert, MD  hydrochlorothiazide (MICROZIDE) 12.5 MG capsule TAKE 1 CAPSULE BY MOUTH ONCE DAILY. 10/28/14  Yes Merlyn Albert, MD  lisinopril (PRINIVIL,ZESTRIL) 40 MG tablet Take 1 tablet (40 mg total) by mouth daily. 10/28/14  Yes Merlyn Albert, MD  lovastatin (MEVACOR) 20 MG tablet Take 1 tablet (20 mg total) by mouth at bedtime. 10/28/14  Yes Merlyn Albert, MD  metFORMIN (GLUCOPHAGE) 500 MG tablet TAKE 1 TABLET BY MOUTH ONCE DAILY WITH BREAKFAST. 10/28/14  Yes Merlyn Albert, MD  nitroGLYCERIN (NITROSTAT) 0.4 MG SL tablet Place 0.4 mg under the tongue  every 5 (five) minutes as needed for chest pain.   Yes Historical Provider, MD  potassium chloride SA (KLOR-CON M20) 20 MEQ tablet Take 1 tablet (20 mEq total) by mouth daily. Patient taking differently: Take 20 mEq by mouth at bedtime.  10/28/14  Yes Merlyn Albert, MD  amoxicillin-clavulanate (AUGMENTIN) 875-125 MG tablet Take 1 tablet by mouth 2 (two) times daily. For 10 days Patient not taking: Reported on 03/07/2015 01/20/15   Merlyn Albert, MD  BAYER MICROLET LANCETS lancets USE AS DIRECTED THREE TIMES DAILY. 08/12/13   Merlyn Albert, MD  citalopram (CELEXA) 20 MG tablet Take 1 tablet (20 mg total) by mouth daily. Patient not taking: Reported on 03/07/2015 04/27/14   Merlyn Albert, MD  FREESTYLE TEST STRIPS test strip USE AS DIRECTED. 08/12/13   Merlyn Albert, MD  vitamin B-12 (CYANOCOBALAMIN)  1000 MCG tablet One q am Patient not taking: Reported on 03/07/2015 07/13/14   Merlyn Albert, MD   No Known Allergies  Review of Systems  Unable to perform ROS: Mental status change    Physical Exam  Nursing note and vitals reviewed. Constitutional: No distress.  HENT:  Head: Normocephalic and atraumatic.  Cardiovascular: Normal rate and regular rhythm.   Respiratory: Effort normal and breath sounds normal. No respiratory distress.  GI: Soft. She exhibits no distension. There is no guarding.  Skin: Skin is warm and dry.  Psychiatric:  Lethargic, opens eyes to command at times.     Vital Signs: BP 106/48 mmHg  Pulse 81  Temp(Src) 98.6 F (37 C) (Axillary)  Resp 20  Ht  (1.676 m)  Wt 56.2 kg (123 lb 14.4 oz)  BMI 20.01 kg/m2  SpO2 95%  SpO2: SpO2: 95 % O2 Device:SpO2: 95 % O2 Flow Rate: .O2 Flow Rate (L/min): 2 L/min  IO: Intake/output summary:  Intake/Output Summary (Last 24 hours) at 03/08/15 1224 Last data filed at 03/08/15 0930  Gross per 24 hour  Intake      0 ml  Output      0 ml  Net      0 ml    LBM: Last BM Date:  (unknown ) Baseline Weight: Weight:  52.617 kg (116 lb) Most recent weight: Weight: 56.2 kg (123 lb 14.4 oz)      Palliative Assessment/Data:  Flowsheet Rows        Most Recent Value   Intake Tab    Referral Department  Hospitalist   Unit at Time of Referral  Cardiac/Telemetry Unit   Palliative Care Primary Diagnosis  Pulmonary   Date Notified  03/07/15   Palliative Care Type  New Palliative care   Reason for referral  Clarify Goals of Care, Advance Care Planning, End of Life Care Assistance, Psychosocial or Spiritual support   Date of Admission  03/07/15   Date first seen by Palliative Care  03/08/15   # of days Palliative referral response time  1 Day(s)   # of days IP prior to Palliative referral  0   Clinical Assessment    Palliative Performance Scale Score  20%   Pain Max last 24 hours  Not able to report   Pain Min Last 24 hours  Not able to report   Dyspnea Max Last 24 Hours  Not able to report   Dyspnea Min Last 24 hours  Not able to report   Psychosocial & Spiritual Assessment    Social Work Plan of Care  Staff support   Palliative Care Outcomes    Patient/Family meeting held?  Yes   Who was at the meeting?  patient and daughter [name]   Palliative Care Outcomes  Clarified goals of care, Counseled regarding hospice, Provided psychosocial or spiritual support   Patient/Family wishes: Interventions discontinued/not started   Mechanical Ventilation   Palliative Care follow-up planned  Yes, Facility      Additional Data Reviewed:  CBC:    Component Value Date/Time   WBC 7.3 03/08/2015 0656   HGB 8.2* 03/08/2015 0656   HCT 25.0* 03/08/2015 0656   PLT 256 03/08/2015 0656   MCV 96.5 03/08/2015 0656   NEUTROABS 7.2 03/07/2015 1204   LYMPHSABS 0.8 03/07/2015 1204   MONOABS 0.5 03/07/2015 1204   EOSABS 0.1 03/07/2015 1204   BASOSABS 0.0 03/07/2015 1204   Comprehensive Metabolic Panel:    Component Value Date/Time  NA 138 03/08/2015 0656   K 4.6 03/08/2015 0656   CL 107 03/08/2015 0656   CO2  22 03/08/2015 0656   BUN 15 03/08/2015 0656   CREATININE 0.78 03/08/2015 0656   CREATININE 0.54 10/06/2014 1500   GLUCOSE 103* 03/08/2015 0656   CALCIUM 8.7* 03/08/2015 0656   AST 14* 03/07/2015 1204   ALT 9* 03/07/2015 1204   ALKPHOS 95 03/07/2015 1204   BILITOT 1.2 03/07/2015 1204   BILITOT 0.5 10/28/2014 1512   PROT 7.0 03/07/2015 1204   PROT 6.5 10/28/2014 1512   ALBUMIN 2.6* 03/07/2015 1204   ALBUMIN 4.2 10/28/2014 1512     Time In: 1030  Time Out: 1145 Time Total: 75 minutes Greater than 50%  of this time was spent counseling and coordinating care related to the above assessment and plan.  Signed by: Katheran Awe, NP  Katheran Awe, NP  03/08/2015, 12:24 PM  Please contact Palliative Medicine Team phone at [phone] for questions and concerns.

## 2015-03-08 NOTE — Care Management Note (Signed)
Case Management Note  Patient Details  Name: PRISCILLE SHADDUCK MRN: [id number] Date of Birth: 06-Jun-1950  Subjective/Objective:                  Pt is from home, admitted with CAP. Pt lives with son and has severe dementia. Per family pt has been in decline over past few months. Family is unable to continue to care for her in home. Palliative consult has been made and DC plan is uncertain at this time.   Action/Plan: Will cont to follow for DC planning.   Expected Discharge Date:  03/11/15               Expected Discharge Plan:  Hospice Medical Facility  In-House Referral:  Clinical Social Work, Hospice / Palliative Care  Discharge planning Services  CM Consult  Post Acute Care Choice:    Choice offered to:     DME Arranged:    DME Agency:     HH Arranged:    HH Agency:     Status of Service:  In process, will continue to follow  Medicare Important Message Given:    Date Medicare IM Given:    Medicare IM give by:    Date Additional Medicare IM Given:    Additional Medicare Important Message give by:     If discussed at Long Length of Stay Meetings, dates discussed:    Additional Comments:  Malcolm Metro, RN 03/08/2015, 11:43 AM

## 2015-03-09 DIAGNOSIS — F0280 Dementia in other diseases classified elsewhere without behavioral disturbance: Secondary | ICD-10-CM

## 2015-03-09 DIAGNOSIS — G3183 Dementia with Lewy bodies: Secondary | ICD-10-CM

## 2015-03-09 DIAGNOSIS — E43 Unspecified severe protein-calorie malnutrition: Secondary | ICD-10-CM

## 2015-03-09 LAB — BLOOD GAS, ARTERIAL
Acid-Base Excess: 5.1 mmol/L — ABNORMAL HIGH (ref 0.0–2.0)
BICARBONATE: 20.3 meq/L (ref 20.0–24.0)
DRAWN BY: 21310
FIO2: 100
O2 SAT: 99.8 %
PH ART: 7.35 (ref 7.350–7.450)
TCO2: 15.1 mmol/L (ref 0–100)
pCO2 arterial: 36.4 mmHg (ref 35.0–45.0)
pO2, Arterial: 325 mmHg — ABNORMAL HIGH (ref 80.0–100.0)

## 2015-03-09 LAB — GLUCOSE, CAPILLARY
GLUCOSE-CAPILLARY: 107 mg/dL — AB (ref 65–99)
GLUCOSE-CAPILLARY: 99 mg/dL (ref 65–99)
GLUCOSE-CAPILLARY: 117 mg/dL — AB (ref 65–99)

## 2015-03-09 MED ORDER — MORPHINE SULFATE (PF) 2 MG/ML IV SOLN
1.0000 mg | INTRAVENOUS | Status: AC
  Administered 2015-03-09: 1 mg via INTRAVENOUS
  Filled 2015-03-09: qty 1

## 2015-03-09 NOTE — Progress Notes (Signed)
PT Cancellation Note  Patient Details Name: Nichole May MRN: [id number] DOB: 02-09-1951   Cancelled Treatment:    Reason Eval/Treat Not Completed: Patient's level of consciousness;Other (comment).  I discussed pt with MD who states that it would be appropriate to remove her from our current pt load.  She may be transitioned to Hospice.  If status changes, please reconsult.   Myrlene Broker L  PT 03/09/2015, 11:45 AM [phone]

## 2015-03-09 NOTE — Progress Notes (Signed)
Initial Nutrition Assessment  DOCUMENTATION CODES:   Severe malnutrition in context of chronic illness  INTERVENTION:  Ensure Enlive po BID, each supplement provides 350 kcal and 20 grams of protein. If pt is alert enough to accept.  Follow for decision for health care goals today after palliative meeting with family  NUTRITION DIAGNOSIS:   Inadequate oral intake related to acute illness, chronic illness, lethargy/confusion as evidenced by percent weight loss, energy intake < or equal to 75% for > or equal to 1 month, moderate depletion of body fat, moderate depletions of muscle mass.   GOAL: Honor pt /family wishes for progression of healthcare/nutritional interventions    MONITOR:   PO intake, Supplement acceptance, Weight trends  REASON FOR ASSESSMENT:   Malnutrition Screening Tool    ASSESSMENT: Pt has hx of depression and dementia.   Overall decreased muscle and fat mass. Pt is unable to provide hx and nursing assistant is present to say pt intake is 0% yesterday and today. Adamant refusal of food or fluids when presented. Palliative is meeting with family today to discuss goals of care. Will follow for the outcome. Labs: reviewed  Pt meets criteria for severe MALNUTRITION in the context of chronic illness as evidenced by unplanned wt loss 16% over the past 4 months and 10% in the past month and her energy intake </= 75% for >/= 1 month.    Diet Order:  DIET SOFT Room service appropriate?: Yes; Fluid consistency:: Thin  Skin:   intact  Last BM:   prior to admission  Height:   Ht Readings from Last 1 Encounters:  03/07/15  (1.676 m)    Weight:   Wt Readings from Last 1 Encounters:  03/07/15 123 lb 14.4 oz (56.2 kg)    Ideal Body Weight:  59 kg  BMI:  Body mass index is 20.01 kg/(m^2).  Estimated Nutritional Needs:   Kcal:  1400-1680  Protein:  67-84 gr  Fluid:  1.4-1.6 liters daily  EDUCATION NEEDS:   No education needs identified at this  time  Royann Shivers MS,RD,CSG,LDN Office: #[phone] Pager: [phone]

## 2015-03-09 NOTE — Progress Notes (Signed)
Pt CBG 107. No coverage required at this time.

## 2015-03-09 NOTE — Progress Notes (Signed)
OT Cancellation Note  Patient Details Name: Nichole May MRN: [id number] DOB: September 14, 1950   Cancelled Treatment:     Reason evaluation not completed: Pt level of consciousness. Pt found to be sleeping soundly this am, did not attempt to wake pt. Spoke with nursing staff, no changes in pt when awake, continues to be agitated and combative, with minimal communication when awake. Chart reviewed, palliative care involved, will hold OT evaluation until determined if pt will be placed with hospice care on discharge.   Ezra Sites, OTR/L  [phone]  03/09/2015, 9:06 AM

## 2015-03-09 NOTE — Progress Notes (Signed)
[facility] PROGRESS NOTE  Nichole May ZOX:[id number] DOB: 27-Aug-1950 DOA: 03/07/2015 PCP: [name], MD  Assessment/Plan: 1. CAP, continue IV abx and supplemental O2. WBC wnl, no fever.  2. Acute hypoxic respiratory failure. Patient became tachypneic overnight, briefly requiring a NRB. Repeat CXR showed worsening of her PNA. She is now back on nasal cannula and appears to be breathing comfortably. Will wean O2 as tolerated. 3. Anemia,  Stable. No evidence of active bleeding. Will continue to monitor.  4. Severe malnutrition. Patient has had decreased by mouth intake for quite some time. Nutrition is following 5. Dementia. Her progressive cognitive decline is likely the driving force behind her overall deterioration. Patient has been refusing to eat or drink significantly for several weeks now. Her by mouth intake remains minimal. She has become malnourished increasingly weak. Palliative care has seen the patient and has confirmed DO NOT RESUSCITATE status. Her current state, she appears to be an appropriate candidate for residential hospice. Family wishes to continue treatment for another 24 hours. Will follow up in a.m .  Code Status: DNR DVT prophylaxis: Lovenox Family Communication: Discussed with sister and niece at the bedside . left voicemail for her daughter.  Disposition Plan: Skilled nursing facility versus residential hospice   Consultants:  Palliative care   Procedures:     Antibiotics:  Ceftriaxone 1/23>>  Azithromycin 1/23>>  HPI/Subjective: Patient does not answer questions. She is sleeping on my arrival and sleeps through most of the visit.  Objective: Filed Vitals:   03/08/15 2300 03/09/15 0500  BP: 129/60 100/51  Pulse:  87  Temp:  98.4 F (36.9 C)  Resp:  20    Intake/Output Summary (Last 24 hours) at 03/09/15 0832 Last data filed at 03/08/15 1523  Gross per 24 hour  Intake 2472.5 ml  Output      0 ml  Net 2472.5 ml   Filed Weights    03/07/15 1122 03/07/15 1640 03/07/15 1753  Weight: 52.617 kg (116 lb) 56.2 kg (123 lb 14.4 oz) 56.2 kg (123 lb 14.4 oz)    Exam:  General: NAD, looks comfortable, appears chronically ill Cardiovascular: RRR, S1, S2  Respiratory: Occasional rhonchi, mostly clear Abdomen: soft, non tender, no distention , bowel sounds normal Musculoskeletal: No edema b/l   Data Reviewed: Basic Metabolic Panel:  Recent Labs Lab 03/07/15 1204 03/07/15 1651 03/08/15 0656  NA 133*  --  138  K 4.6  --  4.6  CL 99*  --  107  CO2 22  --  22  GLUCOSE 107*  --  103*  BUN 29*  --  15  CREATININE 0.91  --  0.78  CALCIUM 9.3  --  8.7*  MG  --  2.0  --    Liver Function Tests:  Recent Labs Lab 03/07/15 1204  AST 14*  ALT 9*  ALKPHOS 95  BILITOT 1.2  PROT 7.0  ALBUMIN 2.6*    CBC:  Recent Labs Lab 03/07/15 1204 03/08/15 0656  WBC 8.6 7.3  NEUTROABS 7.2  --   HGB 8.7* 8.2*  HCT 26.2* 25.0*  MCV 96.7 96.5  PLT 296 256   Cardiac Enzymes:  Recent Labs Lab 03/07/15 1204  TROPONINI <0.03    CBG:  Recent Labs Lab 03/08/15 0740 03/08/15 1205 03/08/15 1619 03/08/15 2023 03/09/15 0717  GLUCAP 94 78 94 99 107*    Recent Results (from the past 240 hour(s))  Blood culture (routine x 2)     Status: None (Preliminary result)  Collection Time: 03/07/15  1:32 PM  Result Value Ref Range Status   Specimen Description BLOOD  Final   Special Requests NONE  Final   Culture NO GROWTH < 24 HOURS  Final   Report Status PENDING  Incomplete  Blood culture (routine x 2)     Status: None (Preliminary result)   Collection Time: 03/07/15  1:37 PM  Result Value Ref Range Status   Specimen Description BLOOD  Final   Special Requests NONE  Final   Culture NO GROWTH < 24 HOURS  Final   Report Status PENDING  Incomplete     Studies: Dg Chest 2 View  03/07/2015  CLINICAL DATA:  Altered mental status.  Cough and loss of appetite. EXAM: CHEST  2 VIEW COMPARISON:  10/08/2011 FINDINGS:  Cardiomediastinal silhouette is normal. Mediastinal contours appear intact. There is no evidence of pleural effusion or pneumothorax. There is a patchy airspace consolidation in the anterior segment of the right upper lobe, likely representing pneumonia. Osseous structures are without acute abnormality. Soft tissues are grossly normal. IMPRESSION: Right upper lobe pneumonia. Electronically Signed   By: Ted Mcalpine M.D.   On: 03/07/2015 12:53   Ct Head Wo Contrast  03/07/2015  CLINICAL DATA:  Altered mental status, no known injury. EXAM: CT HEAD WITHOUT CONTRAST TECHNIQUE: Contiguous axial images were obtained from the base of the skull through the vertex without intravenous contrast. COMPARISON:  07/07/2014 FINDINGS: There is no evidence of mass effect, midline shift, or extra-axial fluid collections. There is no evidence of a space-occupying lesion or intracranial hemorrhage. There is no evidence of a cortical-based area of acute infarction. There is generalized cerebral atrophy. There is severe periventricular white matter low attenuation likely secondary to microangiopathy. The ventricles and sulci are appropriate for the patient's age. The basal cisterns are patent. Visualized portions of the orbits are unremarkable. The visualized portions of the paranasal sinuses and mastoid air cells are unremarkable. Cerebrovascular atherosclerotic calcifications are noted. The osseous structures are unremarkable. IMPRESSION: No acute intracranial pathology. Electronically Signed   By: Elige Ko   On: 03/07/2015 13:37   Dg Chest Port 1 View  03/08/2015  CLINICAL DATA:  Shortness of breath and tachypnea worsening over 24 hours. EXAM: PORTABLE CHEST 1 VIEW COMPARISON:  03/07/2015 FINDINGS: Increasing consolidation in the right upper lung with prominence of the right hilum. Changes may represent pneumonia although centrally obstructing lesion should be excluded. Followup PA and lateral chest X-ray is  recommended in 3-4 weeks following trial of antibiotic therapy to ensure resolution and exclude underlying malignancy. Heart size and pulmonary vascularity are normal. Left lung remains clear. IMPRESSION: Progressing consolidation in the right upper lung with prominence of the right hilum. Changes may represent worsening pneumonia. Central obstructing lesion should be excluded. Followup PA and lateral chest X-ray is recommended in 3-4 weeks following trial of antibiotic therapy to ensure resolution and exclude underlying malignancy. Electronically Signed   By: Burman Nieves M.D.   On: 03/08/2015 23:59    Scheduled Meds: . aspirin EC  81 mg Oral Daily  . azithromycin  500 mg Intravenous Q24H  . cefTRIAXone (ROCEPHIN)  IV  1 g Intravenous Q24H  . citalopram  20 mg Oral Daily  . clopidogrel  75 mg Oral Daily  . enoxaparin (LOVENOX) injection  40 mg Subcutaneous Q24H  . potassium chloride SA  20 mEq Oral QHS  . pravastatin  10 mg Oral q1800   Continuous Infusions: . sodium chloride 20 mL/hr at 03/08/15  2339    Active Problems:   CAP (community acquired pneumonia)   Failure to thrive in adult   Palliative care by specialist   DNR (do not resuscitate) discussion   Dehydration    Time spent: 25  minutes   Lil Lepage. MD  [facility] Pager [phone]. If 7PM-7AM, please contact night-coverage at [email or web address], password [email or web address] 03/09/2015, 8:32 AM  LOS: 2 days
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
## 2015-03-09 NOTE — Progress Notes (Signed)
Pt CBG 117. No coverage required at this time.

## 2015-03-09 NOTE — Care Management (Signed)
Patient intake referral paperwork faxed to [facility] [phone]

## 2015-03-10 LAB — GLUCOSE, CAPILLARY
GLUCOSE-CAPILLARY: 115 mg/dL — AB (ref 65–99)
Glucose-Capillary: 90 mg/dL (ref 65–99)
Glucose-Capillary: 103 mg/dL — ABNORMAL HIGH (ref 65–99)

## 2015-03-10 NOTE — Progress Notes (Signed)
Daily Progress Note   Patient Name: Nichole May       Date: 03/10/2015 DOB: 03-17-1950  Age: 65 y.o. MRN#: [id number] Attending Physician: Erick Blinks, MD Primary Care Physician: [name], MD Admit Date: 03/07/2015  Reason for Consultation/Follow-up: Establishing goals of care and Psychosocial/spiritual support  Subjective: Nichole May opens her eyes when I speak to her. She is able to follow directions today, raising and lowering her arm.  She tells me that she wants to get better, but continues to decline food or drink.  Her daughter, Nichole May, is at bedside along with her friend Nichole May, and Nichole May's sister, Nichole May.  We talk about the improvement with wakefulness, but the concerns related to her intake continue.  Nichole May shares that Nichole May may have had "two servings", over the last month, just a bite here and there.  We talk about feeding tubes, temp through nose, (uncomfortable and she must allow this friend mentions 'something else for her to pull out') and PEG tube.  Nichole May flatly states "No" when I ask about a tube to feed her and Nichole May agrees that this would not change any underlying problems.   Nichole May shares that she doesn't want to feel that she has given up too soon.  I share that the medical team understands.  She wants another 24 hours treatment with IV antibiotics (continue the course) and evaluate tomorrow.  I share that even if she is able to overcome this PNE, she must eat. Nichole May agrees.  She talks about going to rehab in the future, and I share that if she is able to follow commands we will have PT evaluation, but again, our concern is over her intake.   Length of Stay: 3 days  Current Medications: Scheduled Meds:  . aspirin EC  81 mg Oral Daily  .  azithromycin  500 mg Intravenous Q24H  . cefTRIAXone (ROCEPHIN)  IV  1 g Intravenous Q24H  . citalopram  20 mg Oral Daily  . clopidogrel  75 mg Oral Daily  . enoxaparin (LOVENOX) injection  40 mg Subcutaneous Q24H  . potassium chloride SA  20 mEq Oral QHS  . pravastatin  10 mg Oral q1800    Continuous Infusions: . sodium chloride 20 mL/hr at 03/10/15 0454    PRN Meds: levalbuterol, LORazepam  Physical Exam: Physical Exam  Constitutional: No distress.  Opens eyes voluntarily, able to follow directions today.  HENT:  Head: Normocephalic and atraumatic.  Cardiovascular: Normal rate and regular rhythm.   Pulmonary/Chest: Effort normal.  Abdominal: Soft. There is no guarding.  Neurological:  Able to follow directions today, opens eyes voluntarily, but closes eyes frequently.   Skin: Skin is warm and dry.  Nursing note and vitals reviewed.               Vital Signs: BP 124/84 mmHg  Pulse 115  Temp(Src) 98.2 F (36.8 C) (Axillary)  Resp 18  Ht  (1.676 m)  Wt 56.2 kg (123 lb 14.4 oz)  BMI 20.01 kg/m2  SpO2 98% SpO2: SpO2: 98 % O2 Device: O2 Device: Nasal Cannula O2 Flow Rate: O2 Flow Rate (L/min): 3 L/min  Intake/output summary:  Intake/Output Summary (Last 24 hours) at 03/10/15 1401 Last data filed at 03/10/15 0600  May per 24 hour  Intake    760 ml  Output      0 ml  Net    760 ml   LBM: Last BM Date: 03/09/15 Baseline Weight: Weight: 52.617 kg (116 lb) Most recent weight: Weight: 56.2 kg (123 lb 14.4 oz)       Palliative Assessment/Data: Flowsheet Rows        Most Recent Value   Intake Tab    Referral Department  Hospitalist   Unit at Time of Referral  Cardiac/Telemetry Unit   Palliative Care Primary Diagnosis  Pulmonary   Date Notified  03/07/15   Palliative Care Type  New Palliative care   Reason for referral  Clarify Goals of Care, Advance Care Planning, End of Life Care Assistance, Psychosocial or Spiritual support   Date of Admission  03/07/15    Date first seen by Palliative Care  03/08/15   # of days Palliative referral response time  1 Day(s)   # of days IP prior to Palliative referral  0   Clinical Assessment    Palliative Performance Scale Score  20%   Pain Max last 24 hours  Not able to report   Pain Min Last 24 hours  Not able to report   Dyspnea Max Last 24 Hours  Not able to report   Dyspnea Min Last 24 hours  Not able to report   Psychosocial & Spiritual Assessment    Social Work Plan of Care  Staff support   Palliative Care Outcomes    Patient/Family meeting held?  Yes   Who was at the meeting?  patient and daughter Nichole May   Palliative Care Outcomes  Clarified goals of care, Counseled regarding hospice, Provided psychosocial or spiritual support   Patient/Family wishes: Interventions discontinued/not started   Mechanical Ventilation   Palliative Care follow-up planned  Yes, Facility      Additional Data Reviewed: CBC    Component Value Date/Time   WBC 7.3 03/08/2015 0656   RBC 2.59* 03/08/2015 0656   RBC 2.55* 03/07/2015 1651   HGB 8.2* 03/08/2015 0656   HCT 25.0* 03/08/2015 0656   PLT 256 03/08/2015 0656   MCV 96.5 03/08/2015 0656   MCH 31.7 03/08/2015 0656   MCHC 32.8 03/08/2015 0656   RDW 13.7 03/08/2015 0656   LYMPHSABS 0.8 03/07/2015 1204   MONOABS 0.5 03/07/2015 1204   EOSABS 0.1 03/07/2015 1204   BASOSABS 0.0 03/07/2015 1204    CMP     Component Value Date/Time   NA 138 03/08/2015 0656  K 4.6 03/08/2015 0656   CL 107 03/08/2015 0656   CO2 22 03/08/2015 0656   GLUCOSE 103* 03/08/2015 0656   BUN 15 03/08/2015 0656   CREATININE 0.78 03/08/2015 0656   CREATININE 0.54 10/06/2014 1500   CALCIUM 8.7* 03/08/2015 0656   PROT 7.0 03/07/2015 1204   PROT 6.5 10/28/2014 1512   ALBUMIN 2.6* 03/07/2015 1204   ALBUMIN 4.2 10/28/2014 1512   AST 14* 03/07/2015 1204   ALT 9* 03/07/2015 1204   ALKPHOS 95 03/07/2015 1204   BILITOT 1.2 03/07/2015 1204   BILITOT 0.5 10/28/2014 1512   GFRNONAA  >60 03/08/2015 0656   GFRAA >60 03/08/2015 0656       Problem List:  Patient Active Problem List   Diagnosis Date Noted  . Protein-calorie malnutrition, severe 03/09/2015  . Lewy body dementia without behavioral disturbance   . Palliative care by specialist   . DNR (do not resuscitate) discussion   . Dehydration   . CAP (community acquired pneumonia) 03/07/2015  . Failure to thrive in adult 03/07/2015  . Hypokalemia 10/06/2014  . Depression 09/06/2014  . Dementia with behavioral disturbance 09/06/2014  . Erroneous encounter - disregard 07/01/2014  . Hyperlipidemia LDL goal <70 04/27/2014  . Bell's palsy 12/23/2012  . Diabetic neuropathy (HCC) 06/24/2012  . Essential hypertension, benign 06/24/2012  . Systolic dysfunction 10/25/2011  . Diabetes mellitus (HCC) 10/09/2011  . Tobacco abuse 10/09/2011  . CAD (coronary artery disease) 10/09/2011     Palliative Care Assessment & Plan    1.Code Status:  DNR    Code Status Orders        Start     Ordered   03/07/15 1641  Do not attempt resuscitation (DNR)   Continuous    Question Answer Comment  In the event of cardiac or respiratory ARREST Do not call a "code blue"   In the event of cardiac or respiratory ARREST Do not perform Intubation, CPR, defibrillation or ACLS   In the event of cardiac or respiratory ARREST Use medication by any route, position, wound care, and other measures to relive pain and suffering. May use oxygen, suction and manual treatment of airway obstruction as needed for comfort.      03/07/15 1641    Code Status History    Date Active Date Inactive Code Status Order ID Comments User Context   This patient has a current code status but no historical code status.       2. Goals of Care/Additional Recommendations:  Treat the treatable  Limitations on Scope of Treatment: No Artificial Feeding  Desire for further Chaplaincy support:Not discussed today.   Psycho-social Needs: Caregiving   Support/Resources and Education on Hospice  3. Symptom Management:      1.per hospitalist  4. Palliative Prophylaxis:   Aspiration, Frequent Pain Assessment and Turn Reposition  5. Prognosis: Unable to determine, based on Mrs. Heavin's willingness/ability to eat.   6. Discharge Planning:  Undecided at this time.    Care plan was discussed with nursing staff, CM, SW, and Dr. Kerry Hough  Thank you for allowing the Palliative Medicine Team to assist in the care of this patient.   Time In: 1030 Time Out: 1105 Total Time 35 minutes Prolonged Time Billed  no         Katheran Awe, NP  03/10/2015, 2:01 PM  Please contact Palliative Medicine Team phone at [phone] for questions and concerns.

## 2015-03-10 NOTE — Progress Notes (Signed)
OT Cancellation Note  Patient Details Name: ADRINE HAYWORTH MRN: [id number] DOB: [birth year]   Cancelled Treatment:     Pt transferring to hospice care. Will remove from OT caseload.   Ezra Sites, OTR/L  [phone] 03/10/2015, 8:07 AM

## 2015-03-10 NOTE — Progress Notes (Signed)
[facility] PROGRESS NOTE  Nichole May ZOX:[id number] DOB: 1950-08-03 DOA: 03/07/2015 PCP: [name], MD  Assessment/Plan: 1. CAP, continue IV abx and supplemental O2. WBC wnl, no fever.  2. Acute hypoxic respiratory failure. Patient became tachypneic the night of 1/24, briefly requiring a NRB. Repeat CXR showed worsening of her PNA. She has been back on nasal cannula for almost 48 hours and appears to be breathing comfortably. Will wean O2 as tolerated. 3. Anemia, stable. No evidence of active bleeding. Will continue to monitor.  4. Severe malnutrition. Patient has had decreased by mouth intake for quite some time. Nutrition is following 5. Dementia with failure to thrive. Her progressive cognitive decline is likely the driving force behind her overall deterioration. Patient has been refusing to eat or drink significantly for several weeks now. Her by mouth intake remains minimal. She has become malnourished and increasingly weak. Palliative care has seen the patient and has confirmed DO NOT RESUSCITATE status. In her current state, she appears to be an appropriate candidate for residential hospice. Daughter requests until morning to make a decision between SNF and hospice. Patient's daughter is clearly having a difficult time coming to terms with patient's prognosis. Overall mental status does appear to be mildly improving today. Will continue to monitor.  Code Status: DNR DVT prophylaxis: Lovenox Family Communication: Family at bedside.  Disposition Plan: Skilled nursing facility versus residential hospice   Consultants:  Palliative care   Procedures:     Antibiotics:  Ceftriaxone 1/23>>  Azithromycin 1/23>>  HPI/Subjective: Per daughter, patient is still not eating or drinking. At time she appears to be breathing quickly and wheezing. Patient states her breathing is pretty good. Per daughter she has been coughing/gagging.   Objective: Filed Vitals:   03/09/15 2133  03/10/15 0552  BP: 99/49 124/84  Pulse: 97 115  Temp: 99 F (37.2 C) 98.2 F (36.8 C)  Resp: 18 18    Intake/Output Summary (Last 24 hours) at 03/10/15 0805 Last data filed at 03/10/15 0600  Gross per 24 hour  Intake    760 ml  Output      0 ml  Net    760 ml   Filed Weights   03/07/15 1122 03/07/15 1640 03/07/15 1753  Weight: 52.617 kg (116 lb) 56.2 kg (123 lb 14.4 oz) 56.2 kg (123 lb 14.4 oz)    Exam:  General: NAD, looks comfortable, appears chronically ill Cardiovascular: RRR, S1, S2  Respiratory: Mostly clear with occasional rhonchi Abdomen: soft, non tender, no distention , bowel sounds normal Musculoskeletal: No edema b/l   Data Reviewed: Basic Metabolic Panel:  Recent Labs Lab 03/07/15 1204 03/07/15 1651 03/08/15 0656  NA 133*  --  138  K 4.6  --  4.6  CL 99*  --  107  CO2 22  --  22  GLUCOSE 107*  --  103*  BUN 29*  --  15  CREATININE 0.91  --  0.78  CALCIUM 9.3  --  8.7*  MG  --  2.0  --    Liver Function Tests:  Recent Labs Lab 03/07/15 1204  AST 14*  ALT 9*  ALKPHOS 95  BILITOT 1.2  PROT 7.0  ALBUMIN 2.6*    CBC:  Recent Labs Lab 03/07/15 1204 03/08/15 0656  WBC 8.6 7.3  NEUTROABS 7.2  --   HGB 8.7* 8.2*  HCT 26.2* 25.0*  MCV 96.7 96.5  PLT 296 256   Cardiac Enzymes:  Recent Labs Lab 03/07/15 1204  TROPONINI <0.03    CBG:  Recent Labs Lab 03/08/15 2023 03/09/15 0717 03/09/15 1124 03/09/15 1652 03/10/15 0747  GLUCAP 99 107* 99 117* 115*    Recent Results (from the past 240 hour(s))  Blood culture (routine x 2)     Status: None (Preliminary result)   Collection Time: 03/07/15  1:32 PM  Result Value Ref Range Status   Specimen Description BLOOD  Final   Special Requests NONE  Final   Culture NO GROWTH 2 DAYS  Final   Report Status PENDING  Incomplete  Blood culture (routine x 2)     Status: None (Preliminary result)   Collection Time: 03/07/15  1:37 PM  Result Value Ref Range Status   Specimen Description  BLOOD  Final   Special Requests NONE  Final   Culture NO GROWTH 2 DAYS  Final   Report Status PENDING  Incomplete     Studies: Dg Chest Port 1 View  03/08/2015  CLINICAL DATA:  Shortness of breath and tachypnea worsening over 24 hours. EXAM: PORTABLE CHEST 1 VIEW COMPARISON:  03/07/2015 FINDINGS: Increasing consolidation in the right upper lung with prominence of the right hilum. Changes may represent pneumonia although centrally obstructing lesion should be excluded. Followup PA and lateral chest X-ray is recommended in 3-4 weeks following trial of antibiotic therapy to ensure resolution and exclude underlying malignancy. Heart size and pulmonary vascularity are normal. Left lung remains clear. IMPRESSION: Progressing consolidation in the right upper lung with prominence of the right hilum. Changes may represent worsening pneumonia. Central obstructing lesion should be excluded. Followup PA and lateral chest X-ray is recommended in 3-4 weeks following trial of antibiotic therapy to ensure resolution and exclude underlying malignancy. Electronically Signed   By: Burman Nieves M.D.   On: 03/08/2015 23:59    Scheduled Meds: . aspirin EC  81 mg Oral Daily  . azithromycin  500 mg Intravenous Q24H  . cefTRIAXone (ROCEPHIN)  IV  1 g Intravenous Q24H  . citalopram  20 mg Oral Daily  . clopidogrel  75 mg Oral Daily  . enoxaparin (LOVENOX) injection  40 mg Subcutaneous Q24H  . potassium chloride SA  20 mEq Oral QHS  . pravastatin  10 mg Oral q1800   Continuous Infusions: . sodium chloride 20 mL/hr at 03/10/15 0454    Active Problems:   CAP (community acquired pneumonia)   Failure to thrive in adult   Palliative care by specialist   DNR (do not resuscitate) discussion   Dehydration   Protein-calorie malnutrition, severe   Lewy body dementia without behavioral disturbance    Time spent: 25  minutes  Priscila Bean. MD [facility] Pager [phone]. If 7PM-7AM, please  contact night-coverage at [email or web address], password [email or web address] 03/10/2015, 8:32 AM  LOS: 3 days   By signing my name below, I, Burnett Harry, attest that this documentation has been prepared under the direction and in the presence of [facility]. MD Electronically Signed: Burnett Harry, Scribe.   03/10/2015 1:30pm  I, Dr. Erick Blinks, personally performed the services described in this documentaiton. All medical record entries made by the scribe were at my direction and in my presence. I have reviewed the chart and agree that the record reflects my personal performance and is accurate and complete  Erick Blinks, MD, 03/10/2015 2:00 PM

## 2015-03-11 ENCOUNTER — Ambulatory Visit: Payer: 59 | Admitting: Neurology

## 2015-03-11 LAB — BASIC METABOLIC PANEL
Anion gap: 10 (ref 5–15)
BUN: 8 mg/dL (ref 6–20)
CALCIUM: 9.3 mg/dL (ref 8.9–10.3)
CO2: 24 mmol/L (ref 22–32)
Chloride: 107 mmol/L (ref 101–111)
Creatinine, Ser: 0.6 mg/dL (ref 0.44–1.00)
GFR calc Af Amer: >60 mL/min (ref >60)
GLUCOSE: 118 mg/dL — AB (ref 65–99)
POTASSIUM: 3.5 mmol/L (ref 3.5–5.1)
SODIUM: 141 mmol/L (ref 135–145)

## 2015-03-11 LAB — CBC
HCT: 23.9 % — ABNORMAL LOW (ref 36.0–46.0)
Hemoglobin: 7.9 g/dL — ABNORMAL LOW (ref 12.0–15.0)
MCH: 31.6 pg (ref 26.0–34.0)
MCHC: 33.1 g/dL (ref 30.0–36.0)
MCV: 95.6 fL (ref 78.0–100.0)
PLATELETS: 277 10*3/uL (ref 150–400)
RBC: 2.5 MIL/uL — AB (ref 3.87–5.11)
RDW: 14 % (ref 11.5–15.5)
WBC: 8 10*3/uL (ref 4.0–10.5)

## 2015-03-11 LAB — GLUCOSE, CAPILLARY: GLUCOSE-CAPILLARY: 108 mg/dL — AB (ref 65–99)

## 2015-03-11 MED ORDER — LEVALBUTEROL HCL 0.63 MG/3ML IN NEBU: 0.6300 mg | INHALATION_SOLUTION | Freq: Four times a day (QID) | RESPIRATORY_TRACT | Status: AC | PRN

## 2015-03-11 MED ORDER — MORPHINE SULFATE (CONCENTRATE) 10 MG /0.5 ML PO SOLN: 10.0000 mg | ORAL | Status: AC | PRN

## 2015-03-11 MED ORDER — ACETAMINOPHEN 650 MG RE SUPP
650.0000 mg | RECTAL | Status: DC | PRN
  Administered 2015-03-11: 650 mg via RECTAL
  Filled 2015-03-11: qty 1

## 2015-03-11 MED ORDER — LORAZEPAM 2 MG/ML IJ SOLN
1.0000 mg | Freq: Once | INTRAMUSCULAR | Status: AC
  Administered 2015-03-11: 1 mg via INTRAVENOUS
  Filled 2015-03-11: qty 1

## 2015-03-11 MED ORDER — MORPHINE SULFATE (PF) 2 MG/ML IV SOLN: 2.0000 mg | INTRAVENOUS | Status: AC | PRN

## 2015-03-11 MED ORDER — LORAZEPAM 2 MG/ML IJ SOLN: 1.0000 mg | Freq: Four times a day (QID) | INTRAMUSCULAR | Status: AC | PRN

## 2015-03-11 MED ORDER — MORPHINE SULFATE (PF) 2 MG/ML IV SOLN: 2.0000 mg | INTRAVENOUS | Status: DC | PRN

## 2015-03-11 NOTE — Progress Notes (Signed)
Daily Progress Note   Patient Name: Nichole May       Date: 03/11/2015 DOB: [birth year]  Age: 65 y.o. MRN#: [id number] Attending Physician: Erick Blinks, MD Primary Care Physician: [name], MD Admit Date: 03/07/2015  Reason for Consultation/Follow-up: Disposition, Inpatient hospice referral, Psychosocial/spiritual support and Terminal Care  Subjective: Nichole May is lying in bed with her eyes closed. She does not open her eyes for me today, and also seems restless.  Her daughter, Nichole May, Nichole May's husband [name], long time neighbor and Nichole May's best friend husband are at bedside.  Nichole May tells me that her mother had a restless night, and that she talked with her brothers who have helped her decide to accept placement in residential Hospice at [facility].  Nichole May seems to continue with reservations, but states she understands that her mother is not eating or drinking.  Nichole May shares that her mother "hates" the mittens.  We discuss comfort care and changing focus, that hospice home will not have IV fluids or antibiotics.   I share that we will take out the IV and take off the mittens, no more blood sugar checks.  Nichole May is tearful and I reassure her that she is providing a kindness for her mother.  Long time neighbor agrees.    Length of Stay: 4 days  Current Medications: Scheduled Meds:  . citalopram  20 mg Oral Daily    Continuous Infusions:    PRN Meds: acetaminophen, levalbuterol, LORazepam, morphine injection  Physical Exam: Physical Exam  Constitutional: No distress.  Not opening eyes today, restless in bed  HENT:  Head: Normocephalic and atraumatic.  Cardiovascular: Normal rate.   Pulmonary/Chest: Effort normal. No respiratory distress.  Abdominal: Soft.  There is no guarding.  Neurological:  lethargic  Nursing note and vitals reviewed.               Vital Signs: BP 129/69 mmHg  Pulse 100  Temp(Src) 98 F (36.7 C) (Axillary)  Resp 20  Ht  (1.676 m)  Wt 56.2 kg (123 lb 14.4 oz)  BMI 20.01 kg/m2  SpO2 98% SpO2: SpO2: 98 % O2 Device: O2 Device: Not Delivered O2 Flow Rate: O2 Flow Rate (L/min): 3 L/min  Intake/output summary:  Intake/Output Summary (Last 24 hours) at 03/11/15 1324 Last data filed at 03/10/15 1900  Gross per 24 hour  Intake      0 ml  Output      0 ml  Net      0 ml   LBM: Last BM Date: 03/10/15 Baseline Weight: Weight: 52.617 kg (116 lb) Most recent weight: Weight: 56.2 kg (123 lb 14.4 oz)       Palliative Assessment/Data: Flowsheet Rows        Most Recent Value   Intake Tab    Referral Department  Hospitalist   Unit at Time of Referral  Cardiac/Telemetry Unit   Palliative Care Primary Diagnosis  Pulmonary   Date Notified  03/07/15   Palliative Care Type  New Palliative care   Reason for referral  Clarify Goals of Care, Advance Care Planning, End of Life Care Assistance, Psychosocial or Spiritual support   Date of Admission  03/07/15   Date first seen by Palliative Care  03/08/15   # of days Palliative referral response time  1 Day(s)   # of days IP prior to Palliative referral  0   Clinical Assessment    Palliative Performance Scale Score  20%   Pain Max last 24 hours  Not able to report   Pain Min Last 24 hours  Not able to report   Dyspnea Max Last 24 Hours  Not able to report   Dyspnea Min Last 24 hours  Not able to report   Psychosocial & Spiritual Assessment    Social Work Plan of Care  Staff support   Palliative Care Outcomes    Patient/Family meeting held?  Yes   Who was at the meeting?  patient and daughter [name]   Palliative Care Outcomes  Clarified goals of care, Counseled regarding hospice, Provided psychosocial or spiritual support   Patient/Family wishes: Interventions  discontinued/not started   Mechanical Ventilation   Palliative Care follow-up planned  Yes, Facility      Additional Data Reviewed: CBC    Component Value Date/Time   WBC 8.0 03/11/2015 0607   RBC 2.50* 03/11/2015 0607   RBC 2.55* 03/07/2015 1651   HGB 7.9* 03/11/2015 0607   HCT 23.9* 03/11/2015 0607   PLT 277 03/11/2015 0607   MCV 95.6 03/11/2015 0607   MCH 31.6 03/11/2015 0607   MCHC 33.1 03/11/2015 0607   RDW 14.0 03/11/2015 0607   LYMPHSABS 0.8 03/07/2015 1204   MONOABS 0.5 03/07/2015 1204   EOSABS 0.1 03/07/2015 1204   BASOSABS 0.0 03/07/2015 1204    CMP     Component Value Date/Time   NA 141 03/11/2015 0607   K 3.5 03/11/2015 0607   CL 107 03/11/2015 0607   CO2 24 03/11/2015 0607   GLUCOSE 118* 03/11/2015 0607   BUN 8 03/11/2015 0607   CREATININE 0.60 03/11/2015 0607   CREATININE 0.54 10/06/2014 1500   CALCIUM 9.3 03/11/2015 0607   PROT 7.0 03/07/2015 1204   PROT 6.5 10/28/2014 1512   ALBUMIN 2.6* 03/07/2015 1204   ALBUMIN 4.2 10/28/2014 1512   AST 14* 03/07/2015 1204   ALT 9* 03/07/2015 1204   ALKPHOS 95 03/07/2015 1204   BILITOT 1.2 03/07/2015 1204   BILITOT 0.5 10/28/2014 1512   GFRNONAA >60 03/11/2015 0607   GFRAA >60 03/11/2015 4098       Problem List:  Patient Active Problem List   Diagnosis Date Noted  . Protein-calorie malnutrition, severe 03/09/2015  . Lewy body dementia without behavioral disturbance   . Palliative care by specialist   . DNR (do not  resuscitate) discussion   . Dehydration   . CAP (community acquired pneumonia) 03/07/2015  . Failure to thrive in adult 03/07/2015  . Hypokalemia 10/06/2014  . Depression 09/06/2014  . Dementia with behavioral disturbance 09/06/2014  . Erroneous encounter - disregard 07/01/2014  . Hyperlipidemia LDL goal <70 04/27/2014  . Bell's palsy 12/23/2012  . Diabetic neuropathy (HCC) 06/24/2012  . Essential hypertension, benign 06/24/2012  . Systolic dysfunction 10/25/2011  . Diabetes mellitus  (HCC) 10/09/2011  . Tobacco abuse 10/09/2011  . CAD (coronary artery disease) 10/09/2011     Palliative Care Assessment & Plan    1.Code Status:  DNR    Code Status Orders        Start     Ordered   03/07/15 1641  Do not attempt resuscitation (DNR)   Continuous    Question Answer Comment  In the event of cardiac or respiratory ARREST Do not call a "code blue"   In the event of cardiac or respiratory ARREST Do not perform Intubation, CPR, defibrillation or ACLS   In the event of cardiac or respiratory ARREST Use medication by any route, position, wound care, and other measures to relive pain and suffering. May use oxygen, suction and manual treatment of airway obstruction as needed for comfort.      03/07/15 1641    Code Status History    Date Active Date Inactive Code Status Order ID Comments User Context   This patient has a current code status but no historical code status.       2. Goals of Care/Additional Recommendations:  Full comfort care, transition to Hospice in patient at [facility]  Limitations on Scope of Treatment: Full Comfort Care  Desire for further Chaplaincy support:ongoing  Psycho-social Needs: Education on Hospice  3. Symptom Management:      1.symptom management per Hospice.   4. Palliative Prophylaxis:   Aspiration, Frequent Pain Assessment and Turn Reposition  5. Prognosis: < 2 weeks  6. Discharge Planning:  Hospice facility   Care plan was discussed with nursing staff, CM, SW, and Dr. Kerry Hough.   Thank you for allowing the Palliative Medicine Team to assist in the care of this patient.   Time In: 1230 Time Out: 1305 Total Time 35 minutes  Prolonged Time Billed  no         Katheran Awe, NP  03/11/2015, 1:24 PM  Please contact Palliative Medicine Team phone at [phone] for questions and concerns.

## 2015-03-11 NOTE — Progress Notes (Signed)
Report called to [facility] , given to [facility]

## 2015-03-11 NOTE — Progress Notes (Signed)
Patient has been accepted at [facility]- daughter at bedside and agreeable- she became quite tearful; lost her father in the past few years and states, "I know it is for the best". Support provided- MD updated family as well. EMS to transport.   Reece Levy, MSW, Theresia Majors  [phone]

## 2015-03-11 NOTE — Care Management Important Message (Signed)
Important Message  Patient Details  Name: Nichole May MRN: [id number] Date of Birth: Aug 24, 1950   Medicare Important Message Given:  Yes Given to daughter at bedside.    Adonis Huguenin, RN 03/11/2015, 10:59 AM

## 2015-03-11 NOTE — Discharge Summary (Addendum)
Physician Discharge Summary  Nichole May ZOX:[id number] DOB: Apr 27, 1950 DOA: 03/07/2015  PCP: [name], MD  Admit date: 03/07/2015 Discharge date: 03/11/2015  Time spent: 35 minutes  Recommendations for Outpatient Follow-up:  1. Patient will be discharged to residential Hospice.    Discharge Diagnoses:  Active Problems:   CAP (community acquired pneumonia)   Failure to thrive in adult   Palliative care by specialist   DNR (do not resuscitate) discussion   Dehydration   Protein-calorie malnutrition, severe   Lewy body dementia without behavioral disturbance   Discharge Condition: Stable  Diet recommendation: Regular  Filed Weights   03/07/15 1122 03/07/15 1640 03/07/15 1753  Weight: 52.617 kg (116 lb) 56.2 kg (123 lb 14.4 oz) 56.2 kg (123 lb 14.4 oz)    History of present illness:  64 yof with a hx of depression and dementia, recently diagnosed with PNA in December of 2016, presented with failure to thrive and an inability to care for at home.  Per daughter, she spends most the day in bed, has been wearing the same pajamas for weeks and refuses bathing. She's had perhaps 2 full meals over the last month and her fluid intake has been very poor. She has also become incontinent of urine lately. Her son lives with her and her care is becoming too difficult to continue at home. She was ambulatory until a few weeks ago. While in the ED, patient was noted to be somewhat hypotensive. Labs and [place] were unremarkable. Chest x-ray showed right upper lobe pneumonia. She was admitted for further management.   Hospital Course:  Patient presented with failure to thrive and was found to have a right upper lobe PNA however did not appear septic. She was started on IV abx with some improvement and has remained afebrile with a normal WBC. Patient required supplemental O2 initially for her acute hypoxic respiratory failure. Oxygen has since been weaned and she is no longer hypoxic on RA.   Per  daughter, patient has had dementia for the last 3 years with recent failure to thrive. Her progressive cognitive decline is likely the driving force behind her overall deterioration. Patient has been refusing to eat or drink significantly for several weeks now. Her by mouth intake remained minimal during her admission. She has become malnourished and increasingly weak. Palliative care has seen the patient and has confirmed DO NOT RESUSCITATE status. Family has decided to proceed with residential Hospice given her prognosis and deteriorating condition.   1. Anemia, stable. No evidence of active bleeding. Will continue to monitor.  2. Severe malnutrition. Patient has had decreased by mouth intake for quite some time. Nutrition followed however she had minimal improvement.   Procedures:  none  Consultations:  Palliative care  Discharge Exam: Filed Vitals:   03/10/15 2057 03/11/15 0636  BP: 91/66 129/69  Pulse: 111 100  Temp: 98 F (36.7 C) 98 F (36.7 C)  Resp: 18 20     General: NAD, looks comfortable  Cardiovascular: RRR, S1, S2   Respiratory: clear bilaterally, No wheezing, rales or rhonchi  Abdomen: soft, non tender, no distention , bowel sounds normal  Musculoskeletal: No edema b/l   Discharge Instructions   Discharge Instructions    Diet general    Complete by:  As directed      Increase activity slowly    Complete by:  As directed           Current Discharge Medication List    START taking these medications  Details  levalbuterol (XOPENEX) 0.63 MG/3ML nebulizer solution Take 3 mLs (0.63 mg total) by nebulization every 6 (six) hours as needed for wheezing or shortness of breath. Qty: 3 mL, Refills: 12    LORazepam (ATIVAN) 2 MG/ML injection Inject 0.5 mLs (1 mg total) into the vein every 6 (six) hours as needed for anxiety. Qty: 1 mL, Refills: 0    morphine 2 MG/ML injection Inject 1 mL (2 mg total) into the vein every 2 (two) hours as needed (resp  distress). Qty: 1 mL, Refills: 0    Morphine Sulfate (MORPHINE CONCENTRATE) 10 mg / 0.5 ml concentrated solution Take 0.5 mLs (10 mg total) by mouth every 2 (two) hours as needed for severe pain. Qty: 120 mL, Refills: 0      CONTINUE these medications which have NOT CHANGED   Details  acetaminophen (TYLENOL) 500 MG tablet Take 500 mg by mouth every 6 (six) hours as needed for pain.      STOP taking these medications     aspirin 81 MG tablet      clopidogrel (PLAVIX) 75 MG tablet      hydrochlorothiazide (MICROZIDE) 12.5 MG capsule      lisinopril (PRINIVIL,ZESTRIL) 40 MG tablet      lovastatin (MEVACOR) 20 MG tablet      metFORMIN (GLUCOPHAGE) 500 MG tablet      nitroGLYCERIN (NITROSTAT) 0.4 MG SL tablet      potassium chloride SA (KLOR-CON M20) 20 MEQ tablet      amoxicillin-clavulanate (AUGMENTIN) 875-125 MG tablet      BAYER MICROLET LANCETS lancets      citalopram (CELEXA) 20 MG tablet      FREESTYLE TEST STRIPS test strip      vitamin B-12 (CYANOCOBALAMIN) 1000 MCG tablet        No Known Allergies    The results of significant diagnostics from this hospitalization (including imaging, microbiology, ancillary and laboratory) are listed below for reference.    Significant Diagnostic Studies: Dg Chest 2 View  03/07/2015  CLINICAL DATA:  Altered mental status.  Cough and loss of appetite. EXAM: CHEST  2 VIEW COMPARISON:  10/08/2011 FINDINGS: Cardiomediastinal silhouette is normal. Mediastinal contours appear intact. There is no evidence of pleural effusion or pneumothorax. There is a patchy airspace consolidation in the anterior segment of the right upper lobe, likely representing pneumonia. Osseous structures are without acute abnormality. Soft tissues are grossly normal. IMPRESSION: Right upper lobe pneumonia. Electronically Signed   By: Ted Mcalpine M.D.   On: 03/07/2015 12:53   Ct Head Wo Contrast  03/07/2015  CLINICAL DATA:  Altered mental status, no  known injury. EXAM: CT HEAD WITHOUT CONTRAST TECHNIQUE: Contiguous axial images were obtained from the base of the skull through the vertex without intravenous contrast. COMPARISON:  07/07/2014 FINDINGS: There is no evidence of mass effect, midline shift, or extra-axial fluid collections. There is no evidence of a space-occupying lesion or intracranial hemorrhage. There is no evidence of a cortical-based area of acute infarction. There is generalized cerebral atrophy. There is severe periventricular white matter low attenuation likely secondary to microangiopathy. The ventricles and sulci are appropriate for the patient's age. The basal cisterns are patent. Visualized portions of the orbits are unremarkable. The visualized portions of the paranasal sinuses and mastoid air cells are unremarkable. Cerebrovascular atherosclerotic calcifications are noted. The osseous structures are unremarkable. IMPRESSION: No acute intracranial pathology. Electronically Signed   By: Elige Ko   On: 03/07/2015 13:37   Dg Chest  Port 1 View  03/08/2015  CLINICAL DATA:  Shortness of breath and tachypnea worsening over 24 hours. EXAM: PORTABLE CHEST 1 VIEW COMPARISON:  03/07/2015 FINDINGS: Increasing consolidation in the right upper lung with prominence of the right hilum. Changes may represent pneumonia although centrally obstructing lesion should be excluded. Followup PA and lateral chest X-ray is recommended in 3-4 weeks following trial of antibiotic therapy to ensure resolution and exclude underlying malignancy. Heart size and pulmonary vascularity are normal. Left lung remains clear. IMPRESSION: Progressing consolidation in the right upper lung with prominence of the right hilum. Changes may represent worsening pneumonia. Central obstructing lesion should be excluded. Followup PA and lateral chest X-ray is recommended in 3-4 weeks following trial of antibiotic therapy to ensure resolution and exclude underlying malignancy.  Electronically Signed   By: Burman Nieves M.D.   On: 03/08/2015 23:59    Microbiology: Recent Results (from the past 240 hour(s))  Blood culture (routine x 2)     Status: None (Preliminary result)   Collection Time: 03/07/15  1:32 PM  Result Value Ref Range Status   Specimen Description BLOOD  Final   Special Requests NONE  Final   Culture NO GROWTH 4 DAYS  Final   Report Status PENDING  Incomplete  Blood culture (routine x 2)     Status: None (Preliminary result)   Collection Time: 03/07/15  1:37 PM  Result Value Ref Range Status   Specimen Description BLOOD  Final   Special Requests NONE  Final   Culture NO GROWTH 4 DAYS  Final   Report Status PENDING  Incomplete     Labs: Basic Metabolic Panel:  Recent Labs Lab 03/07/15 1204 03/07/15 1651 03/08/15 0656 03/11/15 0607  NA 133*  --  138 141  K 4.6  --  4.6 3.5  CL 99*  --  107 107  CO2 22  --  22 24  GLUCOSE 107*  --  103* 118*  BUN 29*  --  15 8  CREATININE 0.91  --  0.78 0.60  CALCIUM 9.3  --  8.7* 9.3  MG  --  2.0  --   --    Liver Function Tests:  Recent Labs Lab 03/07/15 1204  AST 14*  ALT 9*  ALKPHOS 95  BILITOT 1.2  PROT 7.0  ALBUMIN 2.6*   CBC:  Recent Labs Lab 03/07/15 1204 03/08/15 0656 03/11/15 0607  WBC 8.6 7.3 8.0  NEUTROABS 7.2  --   --   HGB 8.7* 8.2* 7.9*  HCT 26.2* 25.0* 23.9*  MCV 96.7 96.5 95.6  PLT 296 256 277   Cardiac Enzymes:  Recent Labs Lab 03/07/15 1204  TROPONINI <0.03    CBG:  Recent Labs Lab 03/09/15 1652 03/10/15 0747 03/10/15 1141 03/10/15 1650 03/11/15 0758  GLUCAP 117* 115* 90 103* 108*     Signed: Baldemar Dady. MD [facility] 03/11/2015, 11:44 AM   By signing my name below, I, Burnett Harry, attest that this documentation has been prepared under the direction and in the presence of [facility]. MD Electronically Signed: Burnett Harry, Scribe. 03/11/2015   I, Dr. Erick Blinks, personally performed the services  described in this documentaiton. All medical record entries made by the scribe were at my direction and in my presence. I have reviewed the chart and agree that the record reflects my personal performance and is accurate and complete  Erick Blinks, MD, 03/11/2015 11:44 AM

## 2015-03-11 NOTE — Progress Notes (Signed)
Pts daughter very concerned that pt is very restless even after taking 0.5mg  of ativan.  Md informer and ordered  ativan IV once.

## 2015-03-12 LAB — CULTURE, BLOOD (ROUTINE X 2)
CULTURE: NO GROWTH
Culture: NO GROWTH

## 2015-03-14 ENCOUNTER — Ambulatory Visit: Payer: Medicare Other | Admitting: Family Medicine

## 2015-03-16 DEATH — deceased

## 2015-04-11 ENCOUNTER — Ambulatory Visit: Payer: Medicare Other | Admitting: Cardiology

## 2016-12-03 IMAGING — CT CT NECK W/ CM
4 of 5 series · 16 of 33 positions shown, 18 images · IV contrast (Iodine)
Comparison: None.

CLINICAL DATA: Throat pain, difficulty swallowing. History of
diabetes, hypertension, tobacco abuse.

EXAM:
CT NECK WITH CONTRAST
TECHNIQUE: Multidetector CT imaging of the neck was performed using the
standard protocol following the bolus administration of intravenous
contrast.
CONTRAST:  75mL OMNIPAQUE IOHEXOL 300 MG/ML  SOLN

[Series 201: soft tissue, idose (2) · axial · 0.49mm/px · z∈[-37,+119]mm · 4 of 131 slices shown]
[im 27/131  soft-tissue]
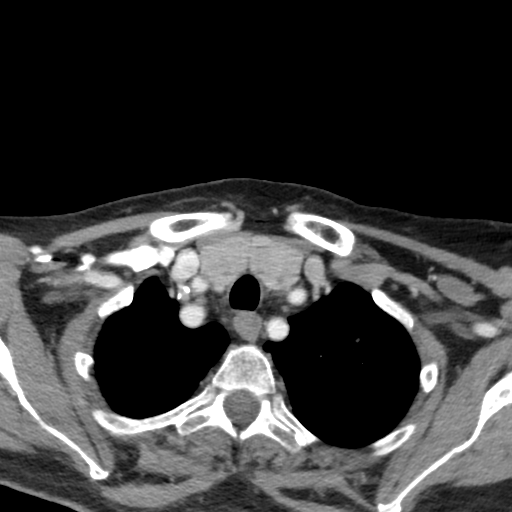
[im 53/131  soft-tissue]
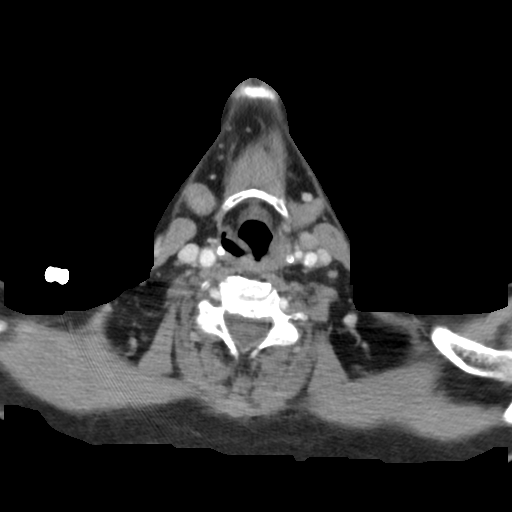
[im 79/131  soft-tissue]
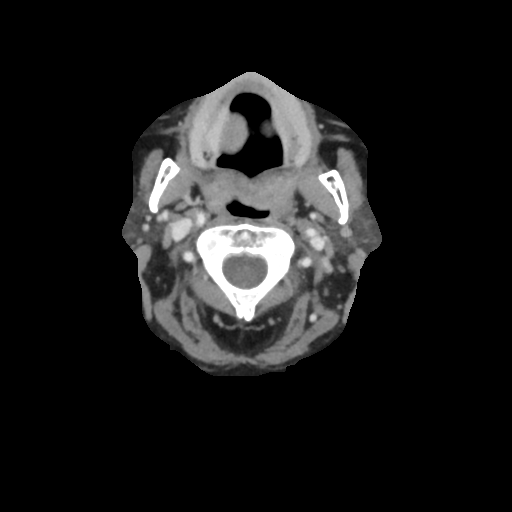
[im 105/131  soft-tissue]
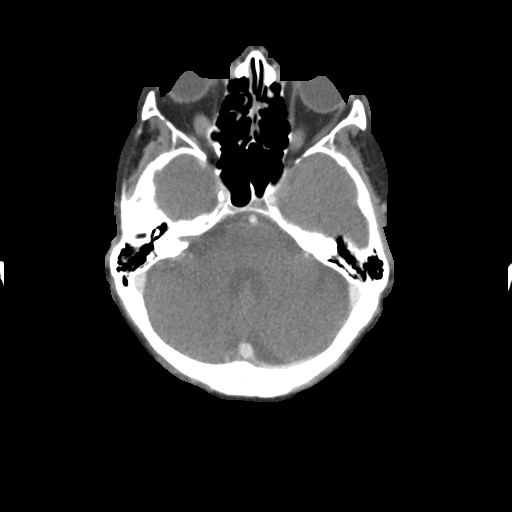

[Series 202: coronal, idose (2) · coronal · 0.45mm/px · 3 of 124 slices shown]
[im 25/124  bone]
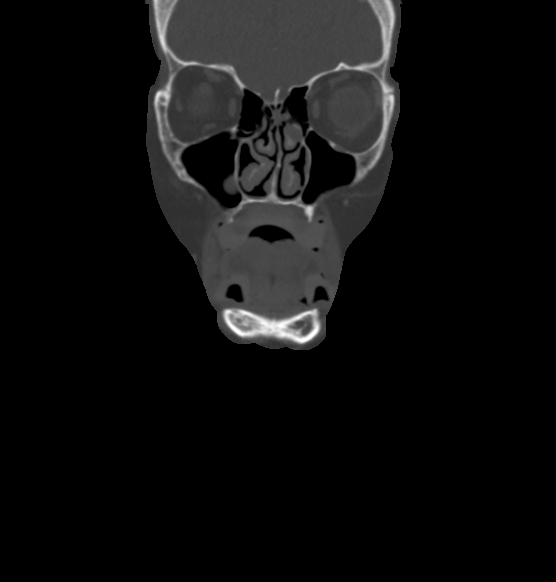
[im 50/124  bone]
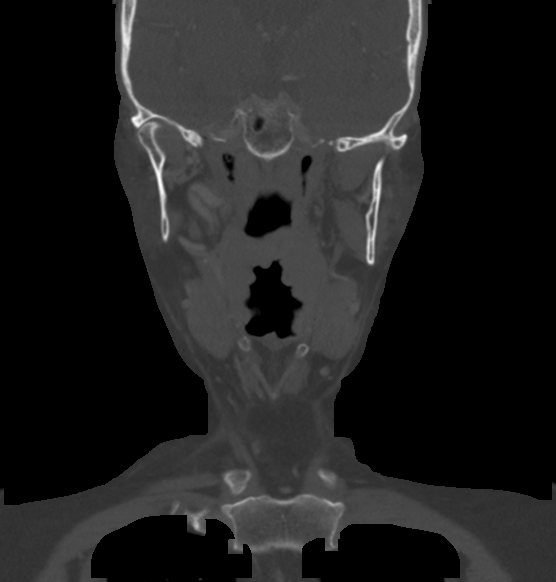
[im 74/124  bone]
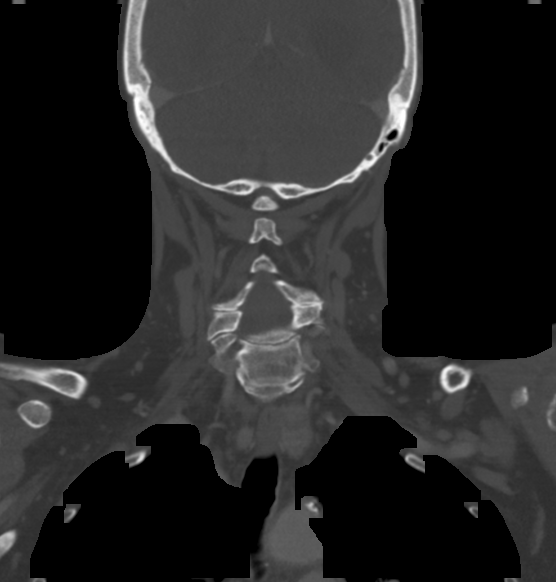

[Series 203: sagittal, idose (2) · sagittal · 0.45mm/px · 5 of 99 slices shown, 6 images]
[im 33/99  bone]
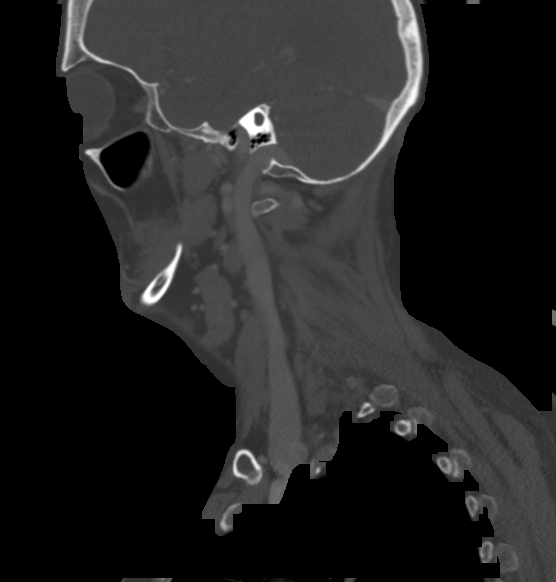
[im 41/99  bone]
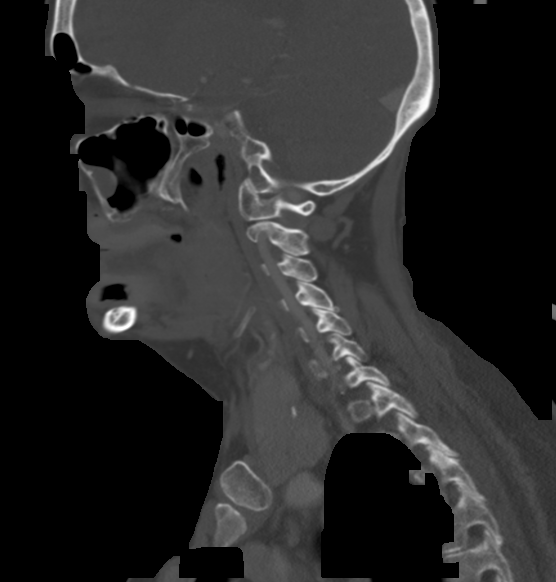
[im 50/99  soft-tissue]
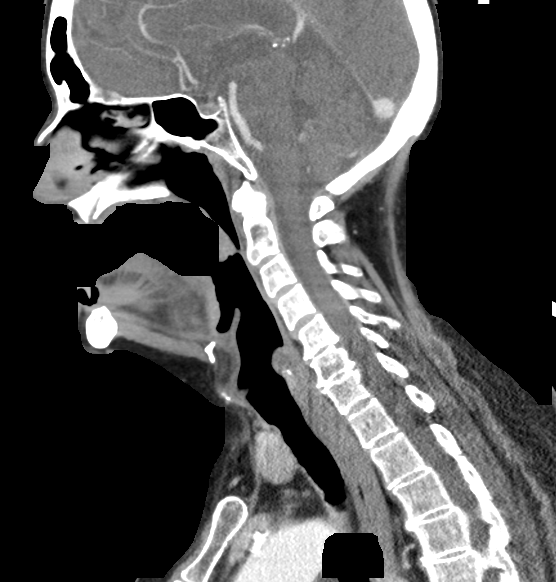
[im 50/99  bone]
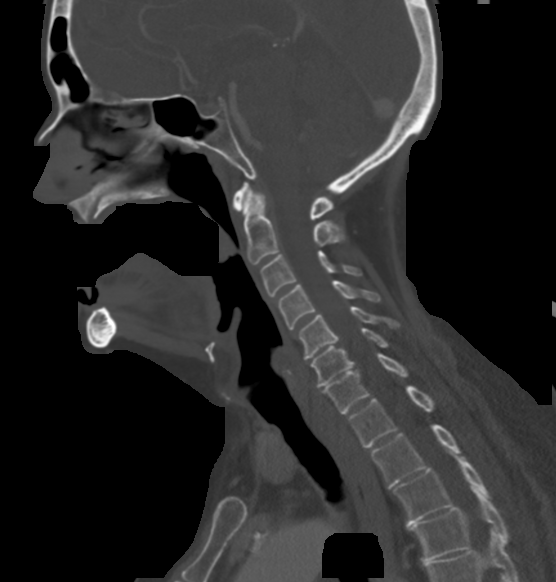
[im 58/99  bone]
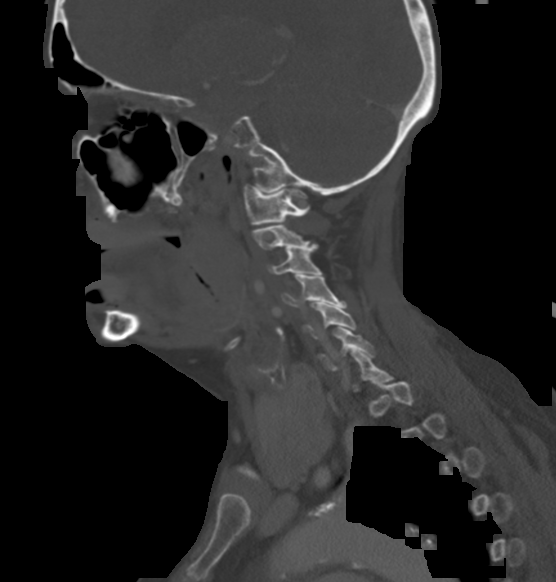
[im 66/99  bone]
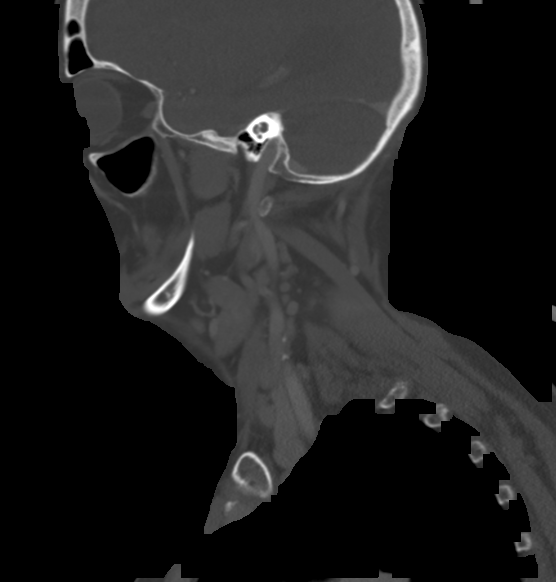

[Series 204: orthogonal, idose (2) · axial · 0.58mm/px · z∈[-49,+110]mm · 4 of 136 slices shown, 5 images]
[im 28/136  soft-tissue]
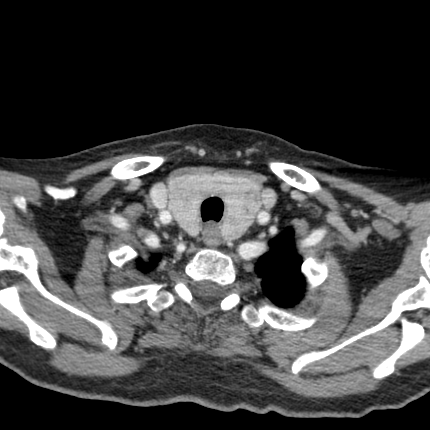
[im 28/136  bone]
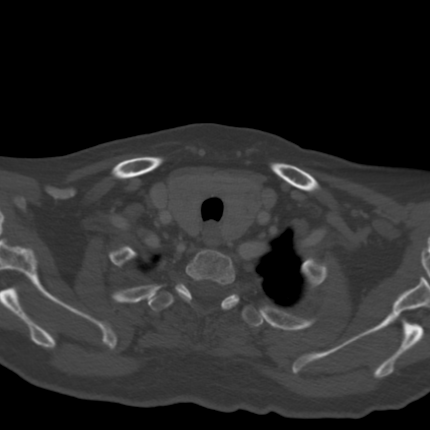
[im 55/136  bone]
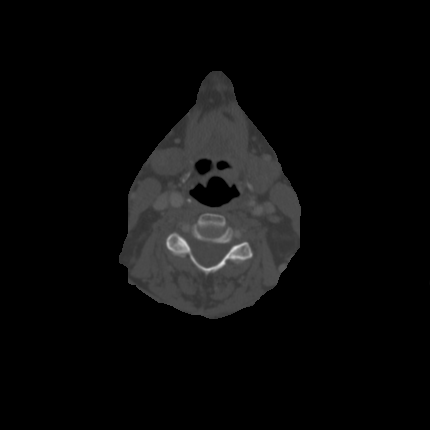
[im 82/136  bone]
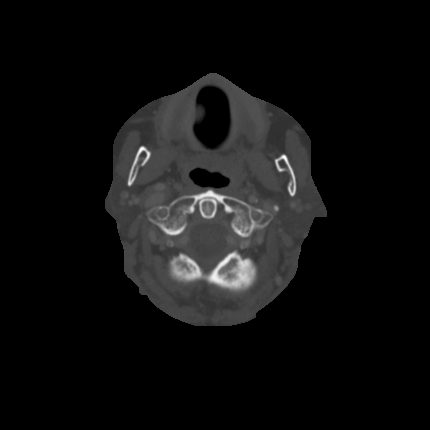
[im 109/136  bone]
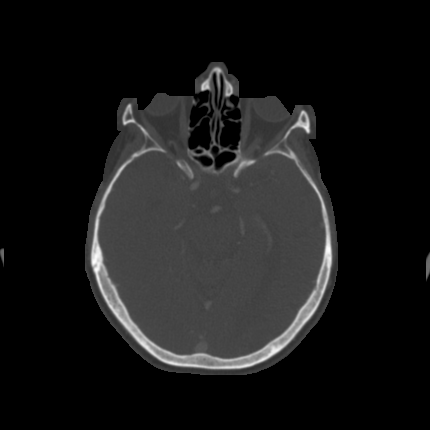

[16 of 33 positions shown; findings below may reference images not displayed]

FINDINGS: Pharynx and larynx: LEFT greater than RIGHT pontine tonsil
enlargement, with crescentic 14 x 3 mm focal fluid. Mild airway
effacement. However no parapharyngeal fat tissue inflammation. 7 mm
nodular density along the anterior aspect of the LEFT epiglottis
extending to the glossal epiglottic fold. Effaced LEFT piriform
sinus. Normal appearance of the larynx.

Salivary glands: Normal.

Thyroid: Thyromegaly, 2 LEFT thyroid nodules measuring up to 14 mm.

Lymph nodes: No lymphadenopathy by CT size criteria. 9 mm LEFT level
3 short axis lymph node.

Vascular: Moderate calcific atherosclerosis of the carotid bulbs
without large vessel occlusion.

Limited intracranial: Moderate parenchymal brain volume loss,
somewhat advanced for age. Confluent supratentorial white matter
hypodensities, advanced for age suggest chronic small vessel
ischemic disease.

Visualized orbits: Normal.

Mastoids and visualized paranasal sinuses: Small RIGHT maxillary
mucosal retention cyst without paranasal sinus air-fluid levels. The
mastoid air cells are well aerated. Soft tissue within the external
auditory canals most consistent with cerumen.

Skeleton: C5-6 and C6-7 moderate to severe degenerative discs
resulting in severe RIGHT C5-6 neural foraminal narrowing. No
destructive bony lesions. Patient is edentulous.

Upper chest: 6 mm LEFT apical ground-glass nodule. LEFT upper lobe
calcified granuloma. Multiple RIGHT upper lobe ground-glass nodules
measuring up to 4 mm.
IMPRESSION: Palatine tonsillar enlargement likely represents tonsillitis,
however no are no peritonsillar inflammatory changes. Crescentic
LEFT tonsillar 14 x 3 mm fluid collection, concerning for early
abscess. Mild airway effacement.

Borderline LEFT level 3 lymph node enlargement may be reactive.

Thyromegaly.

LEFT upper lobe calcified granuloma in addition to scattered
ground-glass nodules measuring up to 6 mm. Initial follow-up by
chest CT without contrast is recommended in 3 months to confirm
persistence. This recommendation follows the consensus statement:
Recommendations for the Management of Subsolid Pulmonary Nodules
Detected at CT: A Statement from the [HOSPITAL] as published

By: Braxton Yamada
